# Patient Record
Sex: Female | Born: 1980 | Race: Black or African American | Hispanic: No | Marital: Married | Smoking: Never smoker
Health system: Southern US, Community
[De-identification: ages and names within clinical notes are randomized; demographics above are authoritative.]

## PROBLEM LIST (undated history)

## (undated) ENCOUNTER — Inpatient Hospital Stay (HOSPITAL_COMMUNITY): Payer: Self-pay

## (undated) DIAGNOSIS — O99280 Endocrine, nutritional and metabolic diseases complicating pregnancy, unspecified trimester: Secondary | ICD-10-CM

## (undated) DIAGNOSIS — E079 Disorder of thyroid, unspecified: Secondary | ICD-10-CM

## (undated) DIAGNOSIS — B181 Chronic viral hepatitis B without delta-agent: Secondary | ICD-10-CM

## (undated) DIAGNOSIS — N390 Urinary tract infection, site not specified: Secondary | ICD-10-CM

## (undated) DIAGNOSIS — Z789 Other specified health status: Secondary | ICD-10-CM

## (undated) DIAGNOSIS — O98919 Unspecified maternal infectious and parasitic disease complicating pregnancy, unspecified trimester: Secondary | ICD-10-CM

## (undated) HISTORY — DX: Disorder of thyroid, unspecified: E07.9

## (undated) HISTORY — DX: Endocrine, nutritional and metabolic diseases complicating pregnancy, unspecified trimester: O99.280

## (undated) HISTORY — PX: NO PAST SURGERIES: SHX2092

## (undated) HISTORY — DX: Unspecified maternal infectious and parasitic disease complicating pregnancy, unspecified trimester: O98.919

## (undated) HISTORY — DX: Chronic viral hepatitis B without delta-agent: B18.1

## (undated) HISTORY — DX: Urinary tract infection, site not specified: N39.0

---

## 2014-10-31 ENCOUNTER — Other Ambulatory Visit (HOSPITAL_COMMUNITY): Payer: Self-pay | Admitting: Nurse Practitioner

## 2014-10-31 DIAGNOSIS — Z3682 Encounter for antenatal screening for nuchal translucency: Secondary | ICD-10-CM

## 2014-11-08 ENCOUNTER — Inpatient Hospital Stay (HOSPITAL_COMMUNITY)
Admission: AD | Admit: 2014-11-08 | Discharge: 2014-11-08 | Disposition: A | Discharge status: Home / Self Care | Payer: Medicaid Other | Source: Ambulatory Visit | Attending: Obstetrics & Gynecology | Admitting: Obstetrics & Gynecology

## 2014-11-08 ENCOUNTER — Encounter (HOSPITAL_COMMUNITY): Payer: Self-pay | Admitting: *Deleted

## 2014-11-08 DIAGNOSIS — Z3A13 13 weeks gestation of pregnancy: Secondary | ICD-10-CM | POA: Diagnosis not present

## 2014-11-08 DIAGNOSIS — O210 Mild hyperemesis gravidarum: Secondary | ICD-10-CM | POA: Insufficient documentation

## 2014-11-08 DIAGNOSIS — O219 Vomiting of pregnancy, unspecified: Secondary | ICD-10-CM

## 2014-11-08 HISTORY — DX: Other specified health status: Z78.9

## 2014-11-08 LAB — URINE MICROSCOPIC-ADD ON

## 2014-11-08 LAB — URINALYSIS, ROUTINE W REFLEX MICROSCOPIC
Bilirubin Urine: NEGATIVE
Glucose, UA: NEGATIVE mg/dL
Hgb urine dipstick: NEGATIVE
Ketones, ur: 40 mg/dL — AB
Nitrite: NEGATIVE
Protein, ur: NEGATIVE mg/dL
Specific Gravity, Urine: 1.015 (ref 1.005–1.030)
Urobilinogen, UA: 1 mg/dL (ref 0.0–1.0)
pH: 6.5 (ref 5.0–8.0)

## 2014-11-08 MED ORDER — DEXTROSE 5 % IN LACTATED RINGERS IV BOLUS
1000.0000 mL | Freq: Once | INTRAVENOUS | Status: AC
  Administered 2014-11-08: 1000 mL via INTRAVENOUS

## 2014-11-08 MED ORDER — PROMETHAZINE HCL 25 MG PO TABS
12.5000 mg | ORAL_TABLET | Freq: Four times a day (QID) | ORAL | Status: DC | PRN
Start: 2014-11-08 — End: 2015-05-04

## 2014-11-08 NOTE — Discharge Instructions (Signed)

## 2014-11-08 NOTE — MAU Provider Note (Signed)
Chief Complaint: Fatigue   First Provider Initiated Contact with Patient 11/08/14 1523      SUBJECTIVE HPI: Michelle Logan is a 34 y.o. G1P0 at [redacted]w[redacted]d by LMP who presents to maternity admissions reporting nausea and vomiting and decreased appetite.  She also reports she is nauseous at night and unable to sleep so is fatigued during the day.  She has tried Unisom/B6 which makes her sleepy but does not help. She has not taken these medications daily.   She denies abdominal pain, vaginal bleeding, vaginal itching/burning, urinary symptoms, h/a, dizziness, or fever/chills.     Emesis  This is a recurrent problem. The current episode started 1 to 4 weeks ago. The problem occurs 2 to 4 times per day. The problem has been unchanged. The emesis has an appearance of stomach contents. There has been no fever. Pertinent negatives include no abdominal pain, chest pain, chills, coughing, diarrhea, dizziness, fever or headaches. She has tried diet change (Unisom/B6) for the symptoms. The treatment provided no relief.    Past Medical History  Diagnosis Date  . Medical history non-contributory    Past Surgical History  Procedure Laterality Date  . No past surgeries     Social History   Social History  . Marital Status: Married    Spouse Name: N/A  . Number of Children: N/A  . Years of Education: N/A   Occupational History  . Not on file.   Social History Main Topics  . Smoking status: Never Smoker   . Smokeless tobacco: Never Used  . Alcohol Use: No  . Drug Use: No  . Sexual Activity: Not on file   Other Topics Concern  . Not on file   Social History Narrative  . No narrative on file   No current facility-administered medications on file prior to encounter.   No current outpatient prescriptions on file prior to encounter.   No Known Allergies  ROS:  Review of Systems  Constitutional: Negative for fever, chills and fatigue.  HENT: Negative for sinus pressure.   Eyes: Negative  for photophobia.  Respiratory: Negative for cough and shortness of breath.   Cardiovascular: Negative for chest pain.  Gastrointestinal: Positive for vomiting. Negative for nausea, abdominal pain, diarrhea and constipation.  Genitourinary: Negative for dysuria, frequency, flank pain, vaginal bleeding, vaginal discharge, difficulty urinating, vaginal pain and pelvic pain.  Musculoskeletal: Negative for neck pain.  Neurological: Negative for dizziness, weakness and headaches.  Psychiatric/Behavioral: Negative.      I have reviewed patient's Past Medical Hx, Surgical Hx, Family Hx, Social Hx, medications and allergies.   Physical Exam  Patient Vitals for the past 24 hrs:  BP Temp Temp src Pulse Resp SpO2 Weight  11/08/14 1326 106/66 mmHg 98.3 F (36.8 C) Oral 86 16 100 % 58.695 kg (129 lb 6.4 oz)   Constitutional: Well-developed, well-nourished female in no acute distress.  Cardiovascular: normal rate Respiratory: normal effort GI: Abd soft, non-tender. Pos BS x 4 MS: Extremities nontender, no edema, normal ROM Neurologic: Alert and oriented x 4.  GU: Neg CVAT.   LAB RESULTS Results for orders placed or performed during the hospital encounter of 11/08/14 (from the past 24 hour(s))  Urinalysis, Routine w reflex microscopic (not at [facility])     Status: Abnormal   Collection Time: 11/08/14  1:30 PM  Result Value Ref Range   Color, Urine YELLOW YELLOW   APPearance HAZY (A) CLEAR   Specific Gravity, Urine 1.015 1.005 - 1.030   pH 6.5 5.0 -  8.0   Glucose, UA NEGATIVE NEGATIVE mg/dL   Hgb urine dipstick NEGATIVE NEGATIVE   Bilirubin Urine NEGATIVE NEGATIVE   Ketones, ur 40 (A) NEGATIVE mg/dL   Protein, ur NEGATIVE NEGATIVE mg/dL   Urobilinogen, UA 1.0 0.0 - 1.0 mg/dL   Nitrite NEGATIVE NEGATIVE   Leukocytes, UA MODERATE (A) NEGATIVE  Urine microscopic-add on     Status: Abnormal   Collection Time: 11/08/14  1:30 PM  Result Value Ref Range   Squamous Epithelial / LPF FEW (A) RARE    WBC, UA 0-2 <3 WBC/hpf   Urine-Other AMORPHOUS URATES/PHOSPHATES        IMAGING No results found.  MAU Management/MDM: Ordered labs and reviewed results.  Treatments in MAU included D5LR x 1000 ml.  Pt stable at time of discharge.  ASSESSMENT 1. Nausea and vomiting during pregnancy prior to [redacted] weeks gestation     PLAN Discharge home Phenergan 12.5-25 mg PO Q 6 hours PRN, take especially at night to aid sleep Discussed use of OTC medications, like Unisom/B6 to take every day to prevent nausea [facility]    Medication List    TAKE these medications        promethazine 25 MG tablet  Commonly known as:  PHENERGAN  Take 0.5-1 tablets (12.5-25 mg total) by mouth every 6 (six) hours as needed for nausea or vomiting.           Follow-up Information    [facility]   Why:  As scheduled, Return to MAU as needed for emergencies   Contact information:   [address] [place] [address] [phone]       Sharen CounterLisa Leftwich-Kirby Certified Nurse-Midwife 11/08/2014  3:48 PM

## 2014-11-08 NOTE — MAU Note (Signed)
Very tired, no appetite.  Throws up once or twice in the mornings.

## 2014-11-10 ENCOUNTER — Ambulatory Visit (HOSPITAL_COMMUNITY)
Admission: RE | Admit: 2014-11-10 | Discharge: 2014-11-10 | Disposition: A | Discharge status: Home / Self Care | Payer: Medicaid Other | Source: Ambulatory Visit | Attending: Nurse Practitioner | Admitting: Nurse Practitioner

## 2014-11-10 ENCOUNTER — Encounter (HOSPITAL_COMMUNITY): Payer: Self-pay

## 2014-11-10 DIAGNOSIS — Z36 Encounter for antenatal screening of mother: Secondary | ICD-10-CM | POA: Insufficient documentation

## 2014-11-10 DIAGNOSIS — Z3A12 12 weeks gestation of pregnancy: Secondary | ICD-10-CM | POA: Diagnosis not present

## 2014-11-10 DIAGNOSIS — Z3682 Encounter for antenatal screening for nuchal translucency: Secondary | ICD-10-CM

## 2014-11-12 LAB — CULTURE, OB URINE: Culture: >=100000

## 2014-11-14 ENCOUNTER — Telehealth: Payer: Self-pay | Admitting: Medical

## 2014-11-14 ENCOUNTER — Telehealth: Payer: Self-pay | Admitting: Student

## 2014-11-14 DIAGNOSIS — N3000 Acute cystitis without hematuria: Secondary | ICD-10-CM

## 2014-11-14 MED ORDER — NITROFURANTOIN MONOHYD MACRO 100 MG PO CAPS: 100.0000 mg | ORAL_CAPSULE | Freq: Two times a day (BID) | ORAL | Status: DC

## 2014-11-14 NOTE — Telephone Encounter (Signed)
Pt returning call.  Informed of positive urine culture and antibiotic sent to her pharmacy.  All questions answered.   Judeth HornErin Daud Cayer, NP

## 2014-11-14 NOTE — Telephone Encounter (Signed)
LM for patient to return call to MAU. Urine culture needs treatment with Macrobid. Rx sent to patient's pharmacy.   Marny LowensteinJulie N Wenzel, PA-C 11/14/2014 9:07 AM

## 2014-11-18 ENCOUNTER — Other Ambulatory Visit (HOSPITAL_COMMUNITY): Payer: Self-pay

## 2014-11-18 LAB — OB RESULTS CONSOLE ABO/RH: RH TYPE: POSITIVE

## 2014-11-18 LAB — OB RESULTS CONSOLE RPR: RPR: NONREACTIVE

## 2014-11-18 LAB — OB RESULTS CONSOLE HIV ANTIBODY (ROUTINE TESTING): HIV: NONREACTIVE

## 2014-11-18 LAB — OB RESULTS CONSOLE RUBELLA ANTIBODY, IGM: RUBELLA: IMMUNE

## 2014-11-18 LAB — OB RESULTS CONSOLE ANTIBODY SCREEN: Antibody Screen: NEGATIVE

## 2014-11-18 LAB — OB RESULTS CONSOLE HEPATITIS B SURFACE ANTIGEN: HEP B S AG: POSITIVE

## 2014-11-18 LAB — OB RESULTS CONSOLE GC/CHLAMYDIA
Chlamydia: NEGATIVE
Gonorrhea: NEGATIVE

## 2014-12-21 ENCOUNTER — Encounter: Payer: Self-pay | Admitting: Infectious Disease

## 2014-12-21 ENCOUNTER — Ambulatory Visit (INDEPENDENT_AMBULATORY_CARE_PROVIDER_SITE_OTHER): Payer: Medicaid Other | Admitting: Infectious Disease

## 2014-12-21 VITALS — BP 110/67 | HR 92 | Temp 98.0°F | Ht 64.0 in | Wt 137.8 lb

## 2014-12-21 DIAGNOSIS — N3000 Acute cystitis without hematuria: Secondary | ICD-10-CM

## 2014-12-21 DIAGNOSIS — O99282 Endocrine, nutritional and metabolic diseases complicating pregnancy, second trimester: Secondary | ICD-10-CM | POA: Diagnosis not present

## 2014-12-21 DIAGNOSIS — E0590 Thyrotoxicosis, unspecified without thyrotoxic crisis or storm: Secondary | ICD-10-CM | POA: Insufficient documentation

## 2014-12-21 DIAGNOSIS — B181 Chronic viral hepatitis B without delta-agent: Secondary | ICD-10-CM | POA: Diagnosis not present

## 2014-12-21 DIAGNOSIS — O98919 Unspecified maternal infectious and parasitic disease complicating pregnancy, unspecified trimester: Secondary | ICD-10-CM

## 2014-12-21 DIAGNOSIS — E079 Disorder of thyroid, unspecified: Secondary | ICD-10-CM

## 2014-12-21 DIAGNOSIS — N390 Urinary tract infection, site not specified: Secondary | ICD-10-CM

## 2014-12-21 DIAGNOSIS — O99280 Endocrine, nutritional and metabolic diseases complicating pregnancy, unspecified trimester: Secondary | ICD-10-CM

## 2014-12-21 DIAGNOSIS — O98912 Unspecified maternal infectious and parasitic disease complicating pregnancy, second trimester: Secondary | ICD-10-CM | POA: Diagnosis not present

## 2014-12-21 HISTORY — DX: Chronic viral hepatitis B without delta-agent: B18.1

## 2014-12-21 HISTORY — DX: Disorder of thyroid, unspecified: E07.9

## 2014-12-21 HISTORY — DX: Unspecified maternal infectious and parasitic disease complicating pregnancy, unspecified trimester: O98.919

## 2014-12-21 HISTORY — DX: Endocrine, nutritional and metabolic diseases complicating pregnancy, unspecified trimester: O99.280

## 2014-12-21 HISTORY — DX: Urinary tract infection, site not specified: N39.0

## 2014-12-21 LAB — COMPLETE METABOLIC PANEL WITH GFR
ALBUMIN: 3.6 g/dL (ref 3.6–5.1)
ALK PHOS: 62 U/L (ref 33–115)
ALT: 12 U/L (ref 6–29)
AST: 19 U/L (ref 10–30)
BILIRUBIN TOTAL: 0.2 mg/dL (ref 0.2–1.2)
BUN: 5 mg/dL — ABNORMAL LOW (ref 7–25)
CALCIUM: 9.1 mg/dL (ref 8.6–10.2)
CO2: 25 mmol/L (ref 20–31)
Chloride: 102 mmol/L (ref 98–110)
Creat: 0.54 mg/dL (ref 0.50–1.10)
GFR, Est African American: >89 mL/min (ref >=60)
GLUCOSE: 102 mg/dL — AB (ref 65–99)
POTASSIUM: 3.7 mmol/L (ref 3.5–5.3)
Sodium: 137 mmol/L (ref 135–146)
TOTAL PROTEIN: 6 g/dL — AB (ref 6.1–8.1)

## 2014-12-21 NOTE — Progress Notes (Signed)
Consult: hepatitis B in pregnancy  Requesting physician: Dr. Normand Sloop Subjective:    Patient ID: Michelle Logan, female    DOB: Nov 09, 1980, 34 y.o.   MRN: [id number]  HPI  34 year old [place] lady who was diagnosed with Hepatitis B with pregnancy labs. She tested negative for HIV. She is unaware of ever having tested + for Hep B and states that she must've been checked prior to immigration to the [place]. She denies IVDU. She is accompanied by her husband.  She has had some nausea and is now 5 months into her pregnancy. She has minimal abdominal pain but no RUQ pain, no jaundice  Past Medical History  Diagnosis Date  . Medical history non-contributory   . Chronic hepatitis B without delta agent without hepatic coma (HCC) 12/21/2014  . Pregnancy and infectious disease 12/21/2014    Past Surgical History  Procedure Laterality Date  . No past surgeries      No family history on file.    Social History   Social History  . Marital Status: Married    Spouse Name: N/A  . Number of Children: N/A  . Years of Education: N/A   Social History Main Topics  . Smoking status: Never Smoker   . Smokeless tobacco: Never Used  . Alcohol Use: No  . Drug Use: No  . Sexual Activity:    Partners: Male   Other Topics Concern  . Not on file   Social History Narrative    No Known Allergies   Current outpatient prescriptions:  .  methimazole (TAPAZOLE) 5 MG tablet, Take 5 mg by mouth daily., Disp: , Rfl:  .  promethazine (PHENERGAN) 25 MG tablet, Take 0.5-1 tablets (12.5-25 mg total) by mouth every 6 (six) hours as needed for nausea or vomiting., Disp: 30 tablet, Rfl: 2    Review of Systems  Constitutional: Negative for fever, chills, diaphoresis, activity change, appetite change, fatigue and unexpected weight change.  HENT: Negative for congestion, rhinorrhea, sinus pressure, sneezing, sore throat and trouble swallowing.   Eyes: Negative for photophobia and visual disturbance.    Respiratory: Negative for cough, chest tightness, shortness of breath, wheezing and stridor.   Cardiovascular: Negative for chest pain, palpitations and leg swelling.  Gastrointestinal: Positive for nausea. Negative for vomiting, abdominal pain, diarrhea, constipation, blood in stool, abdominal distention and anal bleeding.  Genitourinary: Negative for dysuria, hematuria, flank pain and difficulty urinating.  Musculoskeletal: Negative for myalgias, back pain, joint swelling, arthralgias and gait problem.  Skin: Negative for color change, pallor, rash and wound.  Neurological: Negative for dizziness, tremors, weakness and light-headedness.  Hematological: Negative for adenopathy. Does not bruise/bleed easily.  Psychiatric/Behavioral: Negative for behavioral problems, confusion, sleep disturbance, dysphoric mood, decreased concentration and agitation.       Objective:   Physical Exam  Constitutional: She is oriented to person, place, and time. She appears well-developed and well-nourished. No distress.  HENT:  Head: Normocephalic and atraumatic.  Mouth/Throat: No oropharyngeal exudate.  Eyes: Conjunctivae and EOM are normal. No scleral icterus.  Neck: Normal range of motion. Neck supple.  Cardiovascular: Normal rate and regular rhythm.   Pulmonary/Chest: Effort normal. No respiratory distress. She has no wheezes.  Abdominal: Soft. Bowel sounds are normal. She exhibits no distension. There is no tenderness. There is no rebound.  Musculoskeletal: She exhibits no edema or tenderness.  Neurological: She is alert and oriented to person, place, and time. She exhibits normal muscle tone. Coordination normal.  Skin: Skin is warm and  dry. No rash noted. She is not diaphoretic. No erythema. No pallor.  Psychiatric: She has a normal mood and affect. Her behavior is normal. Judgment and thought content normal.          Assessment & Plan:   Chronic hepatitis B without delta agent without coma  or cirrhosis in pregnant patient:  Check HBV , hep B E antigen and hep B e ag ab, CMP, Hep A ab  If VL significant will start her on Viread. Her child will need the usual Hep B precautions taken at birth  She will also eventually need screening for  HCC  [facility]  Thyroid disease during pregnancy: on methimazole     Acey Lavornelius Van Dam, MD

## 2014-12-22 LAB — HEPATITIS A ANTIBODY, TOTAL: HEP A TOTAL AB: REACTIVE — AB

## 2014-12-23 LAB — HEPATITIS B E ANTIBODY: Hepatitis Be Antibody: REACTIVE — AB

## 2014-12-23 LAB — HEPATITIS B E ANTIGEN: Hepatitis Be Antigen: NONREACTIVE

## 2014-12-26 LAB — HEPATITIS B DNA, ULTRAQUANTITATIVE, PCR
HEPATITIS B DNA (CALC): 2.45 {Log_IU}/mL — AB (ref <1.30)
Hepatitis B DNA: 279 IU/mL — ABNORMAL HIGH (ref <20)

## 2015-01-03 ENCOUNTER — Telehealth: Payer: Self-pay | Admitting: *Deleted

## 2015-01-03 NOTE — Telephone Encounter (Signed)
Patient notified

## 2015-01-03 NOTE — Telephone Encounter (Signed)
-----   Message from Randall Hissornelius N Van Dam, MD sent at 12/30/2014  7:41 PM EST ----- Based on patients current low viral load she should not need to be initiated on hep B treatment with tenofovir but would need HBIG immunoglobulin and Hepaitis B vaccine series started within 12 hours of birth

## 2015-01-08 NOTE — L&D Delivery Note (Signed)
Final Labor Progress Note Called to room evaluate patient 0736, Pt  VE C/C/+2.  FHR remained reassuring with occasional early decelerations.  Vaginal Delivery Note The pt utilized coping skills as pain management.   Spontaneous rupture of membranes today, at 0650, clear.  GBS was positive, Ampicillin x1 dose was given.  Cervical dilation was complete at  0731 NICHD Category 1.     Pushing with guidance began at  0740.   After 14 minutes of pushing the head, shoulders and the body of a viable female infant "Michelle Logan" delivered spontaneously with maternal effort in the ROA position at 0754.   With vigorous tone and spontaneous cry, the infant was placed on moms abd.  After the umbilical cord was clamped it was cut by the Patient, then cord blood was obtained for evaluation.  Spontaneous delivery of a intact placenta with a 3 vessel cord via Shultz at  0800.   Episiotomy: None   The vulva, perineum, vaginal vault, rectum and cervix were inspected and revealed a Second degree perineal, repaired using a 3-0 vicryl on a CT needle and a deep right labial was repaired using a 4-0 vicryl on a SH and 10cc of 1% lidocaine. The rectum sphincter intact after the repair. Patient tolerated repair well.   Postpartum pitocin as ordered.  Fundus firm, lochia minimum, bleeding under control.   EBL 200, Pt hemodynamically stable.   Sponge, laps and needle count correct and verified with the primary care nurse.  Attending MD available at all times.    Routine postpartum orders   Mother unsure about method of contraception  Mom plans to breastfeed   Placenta to pathology: NO     Cord Gases sent to lab: NO Cord blood sent to lab: YES   APGARS:  9 at 1 minute and 9 at 5 minutes Weight:.    Both mom and baby were left in stable condition, baby skin to skin.   Alphonzo Severanceachel Leonard Hendler, CNM, MSN 05/02/2015. 11:19 AM

## 2015-01-10 NOTE — Progress Notes (Signed)
Thanks Minh! 

## 2015-04-24 LAB — OB RESULTS CONSOLE GBS: GBS: POSITIVE

## 2015-05-02 ENCOUNTER — Encounter (HOSPITAL_COMMUNITY): Payer: Self-pay

## 2015-05-02 ENCOUNTER — Inpatient Hospital Stay (HOSPITAL_COMMUNITY)
Admission: AD | Admit: 2015-05-02 | Discharge: 2015-05-04 | DRG: 774 | Disposition: A | Discharge status: Home / Self Care | Payer: Medicaid Other | Source: Ambulatory Visit | Attending: Obstetrics & Gynecology | Admitting: Obstetrics & Gynecology

## 2015-05-02 DIAGNOSIS — Z3A38 38 weeks gestation of pregnancy: Secondary | ICD-10-CM | POA: Diagnosis not present

## 2015-05-02 DIAGNOSIS — O99284 Endocrine, nutritional and metabolic diseases complicating childbirth: Principal | ICD-10-CM | POA: Diagnosis present

## 2015-05-02 DIAGNOSIS — O99824 Streptococcus B carrier state complicating childbirth: Secondary | ICD-10-CM | POA: Diagnosis present

## 2015-05-02 DIAGNOSIS — E0590 Thyrotoxicosis, unspecified without thyrotoxic crisis or storm: Secondary | ICD-10-CM | POA: Diagnosis present

## 2015-05-02 DIAGNOSIS — O9842 Viral hepatitis complicating childbirth: Secondary | ICD-10-CM | POA: Diagnosis present

## 2015-05-02 DIAGNOSIS — Z23 Encounter for immunization: Secondary | ICD-10-CM | POA: Diagnosis not present

## 2015-05-02 DIAGNOSIS — O99283 Endocrine, nutritional and metabolic diseases complicating pregnancy, third trimester: Secondary | ICD-10-CM | POA: Diagnosis present

## 2015-05-02 DIAGNOSIS — O99280 Endocrine, nutritional and metabolic diseases complicating pregnancy, unspecified trimester: Secondary | ICD-10-CM

## 2015-05-02 DIAGNOSIS — IMO0002 Reserved for concepts with insufficient information to code with codable children: Secondary | ICD-10-CM | POA: Diagnosis present

## 2015-05-02 DIAGNOSIS — O9902 Anemia complicating childbirth: Secondary | ICD-10-CM | POA: Diagnosis present

## 2015-05-02 DIAGNOSIS — B181 Chronic viral hepatitis B without delta-agent: Secondary | ICD-10-CM | POA: Diagnosis present

## 2015-05-02 DIAGNOSIS — B951 Streptococcus, group B, as the cause of diseases classified elsewhere: Secondary | ICD-10-CM | POA: Diagnosis present

## 2015-05-02 LAB — RAPID HIV SCREEN (HIV 1/2 AB+AG)
HIV 1/2 ANTIBODIES: NONREACTIVE
HIV-1 P24 Antigen - HIV24: NONREACTIVE

## 2015-05-02 LAB — TYPE AND SCREEN
ABO/RH(D): AB POS
Antibody Screen: NEGATIVE

## 2015-05-02 LAB — CBC
HCT: 30.6 % — ABNORMAL LOW (ref 36.0–46.0)
Hemoglobin: 10.2 g/dL — ABNORMAL LOW (ref 12.0–15.0)
MCH: 25.9 pg — ABNORMAL LOW (ref 26.0–34.0)
MCHC: 33.3 g/dL (ref 30.0–36.0)
MCV: 77.7 fL — AB (ref 78.0–100.0)
Platelets: 266 10*3/uL (ref 150–400)
RBC: 3.94 MIL/uL (ref 3.87–5.11)
RDW: 14.6 % (ref 11.5–15.5)
WBC: 14.1 10*3/uL — AB (ref 4.0–10.5)

## 2015-05-02 LAB — ABO/RH: ABO/RH(D): AB POS

## 2015-05-02 LAB — RPR: RPR: NONREACTIVE

## 2015-05-02 MED ORDER — ACETAMINOPHEN 325 MG PO TABS: 650.0000 mg | ORAL_TABLET | ORAL | Status: DC | PRN

## 2015-05-02 MED ORDER — LACTATED RINGERS IV SOLN
INTRAVENOUS | Status: DC
  Administered 2015-05-02: 125 mL/h via INTRAVENOUS

## 2015-05-02 MED ORDER — COCONUT OIL OIL: 1.0000 "application " | TOPICAL_OIL | Status: DC | PRN

## 2015-05-02 MED ORDER — SODIUM CHLORIDE 0.9 % IV SOLN
2.0000 g | Freq: Once | INTRAVENOUS | Status: AC
  Administered 2015-05-02: 2 g via INTRAVENOUS
  Filled 2015-05-02: qty 2000

## 2015-05-02 MED ORDER — ZOLPIDEM TARTRATE 5 MG PO TABS: 5.0000 mg | ORAL_TABLET | Freq: Every evening | ORAL | Status: DC | PRN

## 2015-05-02 MED ORDER — OXYTOCIN BOLUS FROM INFUSION: 500.0000 mL | INTRAVENOUS | Status: DC

## 2015-05-02 MED ORDER — IBUPROFEN 600 MG PO TABS
600.0000 mg | ORAL_TABLET | Freq: Four times a day (QID) | ORAL | Status: DC
  Administered 2015-05-02 – 2015-05-04 (×8): 600 mg via ORAL
  Filled 2015-05-02 (×8): qty 1

## 2015-05-02 MED ORDER — FENTANYL CITRATE (PF) 100 MCG/2ML IJ SOLN
50.0000 ug | INTRAMUSCULAR | Status: DC | PRN
  Administered 2015-05-02: 50 ug via INTRAVENOUS
  Administered 2015-05-02: 100 ug via INTRAVENOUS
  Filled 2015-05-02 (×2): qty 2

## 2015-05-02 MED ORDER — METHIMAZOLE 5 MG PO TABS
5.0000 mg | ORAL_TABLET | Freq: Every day | ORAL | Status: DC
  Administered 2015-05-02 – 2015-05-04 (×3): 5 mg via ORAL
  Filled 2015-05-02 (×3): qty 1

## 2015-05-02 MED ORDER — OXYTOCIN 10 UNIT/ML IJ SOLN
2.5000 [IU]/h | INTRAVENOUS | Status: DC
  Administered 2015-05-02: 39.96 [IU]/h via INTRAVENOUS
  Filled 2015-05-02: qty 4

## 2015-05-02 MED ORDER — ONDANSETRON HCL 4 MG/2ML IJ SOLN: 4.0000 mg | INTRAMUSCULAR | Status: DC | PRN

## 2015-05-02 MED ORDER — ONDANSETRON HCL 4 MG/2ML IJ SOLN: 4.0000 mg | Freq: Four times a day (QID) | INTRAMUSCULAR | Status: DC | PRN

## 2015-05-02 MED ORDER — LACTATED RINGERS IV SOLN
500.0000 mL | INTRAVENOUS | Status: DC | PRN
Start: 2015-05-02 — End: 2015-05-02

## 2015-05-02 MED ORDER — DIBUCAINE 1 % RE OINT: 1.0000 "application " | TOPICAL_OINTMENT | RECTAL | Status: DC | PRN

## 2015-05-02 MED ORDER — LIDOCAINE HCL (PF) 1 % IJ SOLN
30.0000 mL | INTRAMUSCULAR | Status: AC | PRN
  Administered 2015-05-02: 30 mL via SUBCUTANEOUS
  Filled 2015-05-02 (×2): qty 30

## 2015-05-02 MED ORDER — PRENATAL MULTIVITAMIN CH
1.0000 | ORAL_TABLET | Freq: Every day | ORAL | Status: DC
  Administered 2015-05-03: 1 via ORAL
  Filled 2015-05-02: qty 1

## 2015-05-02 MED ORDER — OXYCODONE-ACETAMINOPHEN 5-325 MG PO TABS: 2.0000 | ORAL_TABLET | ORAL | Status: DC | PRN

## 2015-05-02 MED ORDER — SIMETHICONE 80 MG PO CHEW: 80.0000 mg | CHEWABLE_TABLET | ORAL | Status: DC | PRN

## 2015-05-02 MED ORDER — DEXTROSE 5 % IV SOLN
2.5000 10*6.[IU] | INTRAVENOUS | Status: DC
  Filled 2015-05-02 (×5): qty 2.5

## 2015-05-02 MED ORDER — CITRIC ACID-SODIUM CITRATE 334-500 MG/5ML PO SOLN: 30.0000 mL | ORAL | Status: DC | PRN

## 2015-05-02 MED ORDER — DIPHENHYDRAMINE HCL 25 MG PO CAPS: 25.0000 mg | ORAL_CAPSULE | Freq: Four times a day (QID) | ORAL | Status: DC | PRN

## 2015-05-02 MED ORDER — WITCH HAZEL-GLYCERIN EX PADS: 1.0000 "application " | MEDICATED_PAD | CUTANEOUS | Status: DC | PRN

## 2015-05-02 MED ORDER — PENICILLIN G POTASSIUM 5000000 UNITS IJ SOLR
5.0000 10*6.[IU] | Freq: Once | INTRAVENOUS | Status: DC
  Filled 2015-05-02: qty 5

## 2015-05-02 MED ORDER — SENNOSIDES-DOCUSATE SODIUM 8.6-50 MG PO TABS
2.0000 | ORAL_TABLET | ORAL | Status: DC
  Administered 2015-05-02 – 2015-05-03 (×2): 2 via ORAL
  Filled 2015-05-02 (×2): qty 2

## 2015-05-02 MED ORDER — OXYCODONE-ACETAMINOPHEN 5-325 MG PO TABS
1.0000 | ORAL_TABLET | ORAL | Status: DC | PRN
  Administered 2015-05-02: 1 via ORAL
  Filled 2015-05-02: qty 1

## 2015-05-02 MED ORDER — ONDANSETRON HCL 4 MG PO TABS
4.0000 mg | ORAL_TABLET | ORAL | Status: DC | PRN
Start: 2015-05-02 — End: 2015-05-04

## 2015-05-02 MED ORDER — TETANUS-DIPHTH-ACELL PERTUSSIS 5-2.5-18.5 LF-MCG/0.5 IM SUSP: 0.5000 mL | Freq: Once | INTRAMUSCULAR | Status: DC

## 2015-05-02 MED ORDER — BENZOCAINE-MENTHOL 20-0.5 % EX AERO
1.0000 "application " | INHALATION_SPRAY | CUTANEOUS | Status: DC | PRN
  Administered 2015-05-02 – 2015-05-03 (×2): 1 via TOPICAL
  Filled 2015-05-02 (×3): qty 56

## 2015-05-02 NOTE — MAU Note (Signed)
Walk patient for 1 hour and recheck her per Sabas SousJ Emly CNM

## 2015-05-02 NOTE — MAU Note (Signed)
Pt may go to room 174 in 15 minutes

## 2015-05-02 NOTE — H&P (Signed)
Michelle Logan MRN: [id number]  Subjective: -  Objective: BP 114/73 mmHg  Pulse 102  Temp(Src) 98.2 F (36.8 C) (Oral)  Resp 20  Ht 5\' 4"  (1.626 m)  Wt 71.668 kg (158 lb)  BMI 27.11 kg/m2  SpO2 100%  LMP 08/09/2014      Fetal Monitoring: FHT: 140 bpm, Mod Var, -Decels, +Accels UC: Q2-815min, palpates moderate    Vaginal Exam: SVE:   Dilation: 8 Effacement (%): 90 Station: 0 Exam by:: Michelle Logan, CNM Membranes:SROM  Internal Monitors: None  Augmentation/Induction: Pitocin:None Cytotec: None  Assessment:  IUP at 38wks Cat I FT  Active Labor-Transition Phase GBS Positive  Plan: -VE discussed -Okay for additional dosing of IV pain medications as desired -Will switch PCN to Ampicillin 2 grams in anticipation of delivery -Continue other mgmt as ordered   Michelle CavaJessica L Yaneli Keithley,MSN, CNM 05/02/2015, 7:00 AM

## 2015-05-02 NOTE — H&P (Signed)
Michelle Logan is a 35 y.o. female, G1P0 at 55 weeks, presenting for contractions.  Patient pregnancy and medical history significant for Late to [facility], GBS positive, marginal cord insertion, Chronic Hep. B, and Thyroid disorder.  Patient is currently on tapazole and took last dose yesterday.  Patient considering an epidural for pain mgmt.   Patient Active Problem List   Diagnosis Date Noted  . Chronic hepatitis B without delta agent without hepatic coma (HCC) 12/21/2014  . Pregnancy and infectious disease 12/21/2014  . Thyroid disease during pregnancy 12/21/2014  . UTI (urinary tract infection) 12/21/2014    History of present pregnancy: Patient entered care at 14 weeks.   EDC of 05/16/2015 was established by Definite LMP of 08/09/2014.   Anatomy scan:  19.6 weeks, with normal findings and an posterior placenta.   Additional [place] evaluations:  23.6wks: NORMAL ANATOMY US COMPLETION AND GROWTH, F/U GROWTH IN 4 WEEKS FOR MARGINAL CORD INSERTION.  27.6wks: [place]: SIUP, BREECH, NORMAL FLUID, GROWTH 34%, CX 3.63 CM. MARGINAL INSERTION OF THE CORD. 29.6wks: U/S: Singleton pregnancy. Vertex presentation. Posterior placenta. AFI is normal=65th%. Cervix closed. Adnexas unremarkable. Marginal cord appears-1.7cm from placental edge.  Significant prenatal events: 1st Trimester: Late to [facility] 2nd Trimester:C/O FATIGUE.  POSITIVE HBSAG RESULTS. Seen by infectious disease. PT. WITH LOW HEP B VIRAL LOAD NO MEDICATION RECOMMENDED NOW BUT VACCINATION/IGG AFTER DELIVERY. 3rd Trimester:   declined Flu vaccine Pt cc: nauseated and vomiting off and on with heartburn x 3 days.  Last evaluation:  04/28/2015 in office by Dr. Alinda Sierras.  FHR 140, BP 108/68, WT 158lbs, TWG 27lbs  OB History    Gravida Para Term Preterm AB TAB SAB Ectopic Multiple Living   1         0     Past Medical History  Diagnosis Date  . Medical history non-contributory   . Chronic hepatitis B without delta agent without hepatic coma (HCC) 12/21/2014  .  Pregnancy and infectious disease 12/21/2014  . Thyroid disease during pregnancy 12/21/2014  . UTI (urinary tract infection) 12/21/2014   Past Surgical History  Procedure Laterality Date  . No past surgeries     Family History: family history is not on file. Social History:  reports that she has never smoked. She has never used smokeless tobacco. She reports that she does not drink alcohol or use illicit drugs.   Prenatal Transfer Tool  Maternal Diabetes: No Genetic Screening: Declined Maternal Ultrasounds/Referrals: Abnormal:  Findings:   Other: Marginal Cord Insertion, Infectious Disease Referral Fetal Ultrasounds or other Referrals:  None Maternal Substance Abuse:  No Significant Maternal Medications:  Meds include: Other: Tapazole Significant Maternal Lab Results: Lab values include: Group B Strep positive, HBsAG positive    ROS:  +FM, -LoF, +Bloody Show, +Ctx  No Known Allergies   Dilation: 5 Effacement (%): 90 Station: -2, -1 Exam by:: Camelia Eng RN Blood pressure 113/70, pulse 114, temperature 99 F (37.2 C), temperature source Oral, resp. rate 18, height  (1.626 m), weight 71.668 kg (158 lb), last menstrual period 08/09/2014, SpO2 100 %.  Physical Exam  Constitutional: She is oriented to person, place, and time. She appears well-developed and well-nourished. She appears distressed (With contractions).  HENT:  Head: Normocephalic and atraumatic.  Eyes: Conjunctivae are normal.  Neck: Normal range of motion.  Cardiovascular: Normal rate.   Respiratory: Effort normal.  GI: Soft.  Musculoskeletal: Normal range of motion.  Neurological: She is alert and oriented to person, place, and time.  Skin: Skin  is warm and dry.  Psychiatric: She has a normal mood and affect. Her behavior is normal.     FHR: 145 bpm, Mod Var, +Early Decels, +Accels UCs:  Q2-695min  Prenatal labs: ABO, Rh: AB/Positive/-- (11/11 0000) Antibody: Negative (11/11 0000) Rubella:   Immune RPR: Nonreactive (11/11 0000)  HBsAg: Positive (11/11 0000)  HIV: Non-reactive (11/11 0000)  GBS: Positive (04/17 0000) Sickle cell/Hgb electrophoresis:  Normal Pap:  Unknown GC:  Negative Chlamydia:  Negative Other:  TSH Normal    Assessment IUP at 38wks Cat I FT Active Labor GBS Positive Hep B Positive  [facility]: [facility]  Routine Labor and Delivery Orders per [facility] Protocol In room to complete assessment and discuss POC: Expectant Mgmt Okay for epidural when desired Start PCN for prophylaxis Peds to be notified of positive Hep B Dr.TC to be updated as appropriate  Joellyn QuailsJessica L EmlyCNM, MSN 05/02/2015, 3:54 AM

## 2015-05-02 NOTE — MAU Note (Signed)
Pt reports abd pain for 24 hours, some blood on tissue when she wipes.

## 2015-05-02 NOTE — Lactation Note (Signed)
This note was copied from a baby's chart. Lactation Consultation Note  Patient Name: Michelle Logan Reason for consult: Initial assessment Baby at 9 hr of life and mom reports baby has been sleeping since birth. Mom has only offered the breast x2 since birth. Discussed baby behavior, feeding frequency, baby belly size, voids, wt loss, breast changes, and nipple care. Demonstrated manual expression, large drops of colostrum noted bilaterally, spoon in room. Given lactation handouts. Aware of OP services and support group. Mom will offer the breast on demand 8+/24hr and will manually express to spoon feed if baby is not doing well at the breast.      Maternal Data Has patient been taught Hand Expression?: Yes Does the patient have breastfeeding experience prior to this delivery?: No  Feeding Feeding Type: Breast Fed Length of feed: 0 min  LATCH Score/Interventions Latch: Too sleepy or reluctant, no latch achieved, no sucking elicited. Intervention(s): Skin to skin;Teach feeding cues;Waking techniques  Audible Swallowing: None Intervention(s): Skin to skin;Hand expression  Type of Nipple: Everted at rest and after stimulation  Comfort (Breast/Nipple): Soft / non-tender     Hold (Positioning): Full assist, staff holds infant at breast Intervention(s): Support Pillows;Position options;Skin to skin  LATCH Score: 4  Lactation Tools Discussed/Used WIC Program: Yes   Consult Status Consult Status: Follow-up Date: 05/03/15 Follow-up type: In-patient    Michelle Logan Logan, 5:01 PM

## 2015-05-03 LAB — CBC
HCT: 25.2 % — ABNORMAL LOW (ref 36.0–46.0)
Hemoglobin: 8.4 g/dL — ABNORMAL LOW (ref 12.0–15.0)
MCH: 25.9 pg — ABNORMAL LOW (ref 26.0–34.0)
MCHC: 33.3 g/dL (ref 30.0–36.0)
MCV: 77.8 fL — ABNORMAL LOW (ref 78.0–100.0)
PLATELETS: 232 10*3/uL (ref 150–400)
RBC: 3.24 MIL/uL — ABNORMAL LOW (ref 3.87–5.11)
RDW: 14.7 % (ref 11.5–15.5)
WBC: 15.9 10*3/uL — AB (ref 4.0–10.5)

## 2015-05-03 MED ORDER — FERROUS SULFATE 325 (65 FE) MG PO TABS
325.0000 mg | ORAL_TABLET | Freq: Two times a day (BID) | ORAL | Status: DC
  Administered 2015-05-03 – 2015-05-04 (×2): 325 mg via ORAL
  Filled 2015-05-03 (×2): qty 1

## 2015-05-03 NOTE — Progress Notes (Signed)
UR chart review completed.  

## 2015-05-03 NOTE — Progress Notes (Addendum)
Michelle Logan   Subjective: Post Partum Day 1 Vaginal delivery, 2nd degree perineal and deep right labial laceration Patient up ad lib, denies syncope or dizziness. Reports consuming regular diet without issues and denies N/V No issues with urination and reports bleeding is appropriate  Feeding:  breast Contraceptive plan:   unsure  Objective: Temp:  [97.8 F (36.6 C)-98.4 F (36.9 C)] 97.8 F (36.6 C) (04/26 0548) Pulse Rate:  [78-100] 78 (04/26 0300) Resp:  [18-20] 18 (04/26 0548) BP: (99-132)/(62-79) 110/75 mmHg (04/26 0548)  Physical Exam:  General: alert and cooperative Ext: WNL, no significant  edema. No evidence of DVT seen on physical exam. Breast: Soft filling Lungs: CTAB Heart RRR without murmur  Abdomen:  Soft, fundus firm, lochia scant, + bowel sounds, non distended, non tender Lochia: appropriate Uterine Fundus: firm Laceration: healing well    Recent Labs  05/02/15 0400 05/03/15 0517  HGB 10.2* 8.4*  HCT 30.6* 25.2*    Assessment S/P Vaginal Delivery-Day 1 Stable  Normal Involution Anemia - a symptomatic, hemodynamicly stable.  Transfusion discussed if she becomes symptomatic  Plan: Continue current care Plan for discharge tomorrow, Breastfeeding and Lactation consult Lactation support Iron supplement   Lorenzo Pereyra, CNM, MSN 05/03/2015, 9:29 AM

## 2015-05-03 NOTE — Lactation Note (Signed)
This note was copied from a baby's chart. Lactation Consultation Note  Patient Name: Girl Dione Ploverbidemi Castles ONGEX'BToday's Date: 05/03/2015 Reason for consult: Follow-up assessment Baby at 32 hr of life and mom reports bf is going well. She denies breast or nipple pain. She asked about pumping for return to work. Given a Harmony, encouraged to keep putting baby to breast while she is home. Discussed when to start pumping, pumping between feedings, how long to pumping, realistic amount to expect when pumping, milk storage/handing, pumping cleaning. Encouraged mom to f/u with WIC after d/c. She is aware of OP services and support group. She will call as needed for bf support.    Maternal Data    Feeding Feeding Type: Breast Fed  LATCH Score/Interventions Latch: Repeated attempts needed to sustain latch, nipple held in mouth throughout feeding, stimulation needed to elicit sucking reflex. Intervention(s): Skin to skin Intervention(s): Adjust position;Breast compression  Audible Swallowing: Spontaneous and intermittent Intervention(s): Hand expression  Type of Nipple: Everted at rest and after stimulation  Comfort (Breast/Nipple): Soft / non-tender     Hold (Positioning): No assistance needed to correctly position infant at breast. Intervention(s): Position options;Support Pillows  LATCH Score: 9  Lactation Tools Discussed/Used     Consult Status Consult Status: PRN    Rulon Eisenmengerlizabeth E Vickii Volland 05/03/2015, 4:26 PM

## 2015-05-04 MED ORDER — FERROUS SULFATE 325 (65 FE) MG PO TABS: 325.0000 mg | ORAL_TABLET | Freq: Two times a day (BID) | ORAL | Status: DC

## 2015-05-04 MED ORDER — IBUPROFEN 600 MG PO TABS: 600.0000 mg | ORAL_TABLET | Freq: Four times a day (QID) | ORAL | Status: DC

## 2015-05-04 NOTE — Discharge Summary (Signed)
OB Discharge Summary     Patient Name: Michelle Logan DOB: 02-01-1980 MRN: [id number]  Date of admission: 05/02/2015 Delivering MD: Alphonzo SeveranceSTALL, RACHEL   Date of discharge: 05/04/2015  Admitting diagnosis: 38 WEEKS CTX Intrauterine pregnancy: 6076w0d     Secondary diagnosis:  Active Problems:   Chronic hepatitis B without delta agent without hepatic coma (HCC)   Thyroid disease during pregnancy   Indication for care in labor or delivery   Group beta Strep positive   Marginal insertion of umbilical cord   Spontaneous vaginal delivery  Additional problems: None     Discharge diagnosis: Term Pregnancy Delivered and Repair of 2nd Degree and Right Labial Lacerations                                                                                                Post partum procedures:None  Augmentation: None  Complications: None  Hospital course:  Onset of Labor With Vaginal Delivery     35 y.o. yo G1P1001 at 7176w0d was admitted in Active Labor on 05/02/2015. Patient had an uncomplicated labor course as follows:  Membrane Rupture Time/Date: 6:50 AM ,05/02/2015   Intrapartum Procedures: Episiotomy: None [1]                                         Lacerations:  2nd degree [3];Labial [10]  Patient had a delivery of a Viable infant. 05/02/2015  Information for the patient's newborn:  [place], Girl Brian [[id number]][[id number]]  Delivery Method: Vaginal, Spontaneous Delivery (Filed from Delivery Summary)    Pateint had an uncomplicated postpartum course.  She is ambulating, tolerating a regular diet, passing flatus, and urinating well. Reports bleeding is better and infant is breastfeeding despite needing to be awakened. Patient is discharged home in stable condition on 05/04/2015.    Physical exam  Filed Vitals:   05/03/15 0300 05/03/15 0548 05/03/15 1800 05/04/15 0500  BP: 99/64 110/75 106/71 96/60  Pulse: 78  85 75  Temp: 98 F (36.7 C) 97.8 F (36.6 C) 97.7 F (36.5 C) 98 F (36.7 C)   TempSrc: Oral Oral Oral   Resp: 18 18 18 18   Height:      Weight:      SpO2:       Physical Exam:  General: alert, cooperative and no distress Mood/Affect: Appropriate/Bright Lungs: clear to auscultation, no wheezes, rales or rhonchi, symmetric air entry.  Heart: normal rate and regular rhythm. Breast: not examined. Abdomen:  + bowel sounds, Soft, NT Uterine Fundus: firm, U/-2 Lochia: appropriate Laceration: Not Examined Skin: Warm, Dry DVT Evaluation: No evidence of DVT seen on physical exam. No cords or calf tenderness.  Labs: Lab Results  Component Value Date   WBC 15.9* 05/03/2015   HGB 8.4* 05/03/2015   HCT 25.2* 05/03/2015   MCV 77.8* 05/03/2015   PLT 232 05/03/2015   CMP Latest Ref Rng 12/21/2014  Glucose 65 - 99 mg/dL 563(O102(H)  BUN 7 - 25 mg/dL 5(L)  Creatinine 7.560.50 - 1.10 mg/dL 4.330.54  Sodium 135 - 146 mmol/L 137  Potassium 3.5 - 5.3 mmol/L 3.7  Chloride 98 - 110 mmol/L 102  CO2 20 - 31 mmol/L 25  Calcium 8.6 - 10.2 mg/dL 9.1  Total Protein 6.1 - 8.1 g/dL 6.0(L)  Total Bilirubin 0.2 - 1.2 mg/dL 0.2  Alkaline Phos 33 - 115 U/L 62  AST 10 - 30 U/L 19  ALT 6 - 29 U/L 12    Discharge instruction: per After Visit Summary and "Baby and Me Booklet". Pain Management, Peri-Care, Breastfeeding, Who and When to call for postpartum complications. Information Sheet(s) given  Iron Rich Diet and Contraception Choices  After visit meds:    Medication List    ASK your doctor about these medications        methimazole 5 MG tablet  Commonly known as:  TAPAZOLE  Take 5 mg by mouth daily.     prenatal multivitamin Tabs tablet  Take 1 tablet by mouth daily at 12 noon.     promethazine 25 MG tablet  Commonly known as:  PHENERGAN  Take 0.5-1 tablets (12.5-25 mg total) by mouth every 6 (six) hours as needed for nausea or vomiting.        Diet: routine diet  Activity: Advance as tolerated. Pelvic rest for 6 weeks.   Outpatient follow up:6 weeks Follow up Appt:No  future appointments. Follow up Visit:No Follow-up on file.  Postpartum contraception: Undecided  Newborn Data: Live born female  Birth Weight: 5 lb 10.5 oz (2565 g) APGAR: 9, 9  Baby Feeding: Breast Disposition:home with mother   05/04/2015 Cherre Robins, CNM

## 2015-05-04 NOTE — Lactation Note (Signed)
This note was copied from a baby's chart. Lactation Consultation Note  Follow up visit made prior to discharge.  Mom is currently feeding baby.  Breasts are becoming full and milk easily hand expressed.   Assisted with latching baby deeper.  Instructed on milk coming to volume and engorgement treatment.  Manual pump given with instructions on use, cleaning and EBM storage.  Lactation outpatient services and support reviewed and encouraged prn.  Patient Name: Michelle Logan'UToday's Date: 05/04/2015 Reason for consult: Follow-up assessment   Maternal Data    Feeding Feeding Type: Breast Fed Length of feed: 15 min  LATCH Score/Interventions Latch: Grasps breast easily, tongue down, lips flanged, rhythmical sucking. Intervention(s): Waking techniques;Teach feeding cues Intervention(s): Breast massage  Audible Swallowing: Spontaneous and intermittent  Type of Nipple: Everted at rest and after stimulation  Comfort (Breast/Nipple): Soft / non-tender     Hold (Positioning): No assistance needed to correctly position infant at breast. Intervention(s): Breastfeeding basics reviewed  LATCH Score: 10  Lactation Tools Discussed/Used     Consult Status Consult Status: Complete    Huston FoleyMOULDEN, Lavell Supple S 05/04/2015, 11:20 AM

## 2015-05-04 NOTE — Discharge Instructions (Signed)
Contraception Choices Contraception (birth control) is the use of any methods or devices to prevent pregnancy. Below are some methods to help avoid pregnancy. HORMONAL METHODS   Contraceptive implant. This is a thin, plastic tube containing progesterone hormone. It does not contain estrogen hormone. Your health care provider inserts the tube in the inner part of the upper arm. The tube can remain in place for up to 3 years. After 3 years, the implant must be removed. The implant prevents the ovaries from releasing an egg (ovulation), thickens the cervical mucus to prevent sperm from entering the uterus, and thins the lining of the inside of the uterus.  Progesterone-only injections. These injections are given every 3 months by your health care provider to prevent pregnancy. This synthetic progesterone hormone stops the ovaries from releasing eggs. It also thickens cervical mucus and changes the uterine lining. This makes it harder for sperm to survive in the uterus.  Birth control pills. These pills contain estrogen and progesterone hormone. They work by preventing the ovaries from releasing eggs (ovulation). They also cause the cervical mucus to thicken, preventing the sperm from entering the uterus. Birth control pills are prescribed by a health care provider.Birth control pills can also be used to treat heavy periods.  Minipill. This type of birth control pill contains only the progesterone hormone. They are taken every day of each month and must be prescribed by your health care provider.  Birth control patch. The patch contains hormones similar to those in birth control pills. It must be changed once a week and is prescribed by a health care provider.  Vaginal ring. The ring contains hormones similar to those in birth control pills. It is left in the vagina for 3 weeks, removed for 1 week, and then a new one is put back in place. The patient must be comfortable inserting and removing the ring  from the vagina.A health care provider's prescription is necessary.  Emergency contraception. Emergency contraceptives prevent pregnancy after unprotected sexual intercourse. This pill can be taken right after sex or up to 5 days after unprotected sex. It is most effective the sooner you take the pills after having sexual intercourse. Most emergency contraceptive pills are available without a prescription. Check with your pharmacist. Do not use emergency contraception as your only form of birth control. BARRIER METHODS   Female condom. This is a thin sheath (latex or rubber) that is worn over the penis during sexual intercourse. It can be used with spermicide to increase effectiveness.  Female condom. This is a soft, loose-fitting sheath that is put into the vagina before sexual intercourse.  Diaphragm. This is a soft, latex, dome-shaped barrier that must be fitted by a health care provider. It is inserted into the vagina, along with a spermicidal jelly. It is inserted before intercourse. The diaphragm should be left in the vagina for 6 to 8 hours after intercourse.  Cervical cap. This is a round, soft, latex or plastic cup that fits over the cervix and must be fitted by a health care provider. The cap can be left in place for up to 48 hours after intercourse.  Sponge. This is a soft, circular piece of polyurethane foam. The sponge has spermicide in it. It is inserted into the vagina after wetting it and before sexual intercourse.  Spermicides. These are chemicals that kill or block sperm from entering the cervix and uterus. They come in the form of creams, jellies, suppositories, foam, or tablets. They do not require a  prescription. They are inserted into the vagina with an applicator before having sexual intercourse. The process must be repeated every time you have sexual intercourse. INTRAUTERINE CONTRACEPTION  Intrauterine device (IUD). This is a T-shaped device that is put in a woman's uterus  during a menstrual period to prevent pregnancy. There are 2 types:  Copper IUD. This type of IUD is wrapped in copper wire and is placed inside the uterus. Copper makes the uterus and fallopian tubes produce a fluid that kills sperm. It can stay in place for 10 years.  Hormone IUD. This type of IUD contains the hormone progestin (synthetic progesterone). The hormone thickens the cervical mucus and prevents sperm from entering the uterus, and it also thins the uterine lining to prevent implantation of a fertilized egg. The hormone can weaken or kill the sperm that get into the uterus. It can stay in place for 3-5 years, depending on which type of IUD is used. PERMANENT METHODS OF CONTRACEPTION  Female tubal ligation. This is when the woman's fallopian tubes are surgically sealed, tied, or blocked to prevent the egg from traveling to the uterus.  Hysteroscopic sterilization. This involves placing a small coil or insert into each fallopian tube. Your doctor uses a technique called hysteroscopy to do the procedure. The device causes scar tissue to form. This results in permanent blockage of the fallopian tubes, so the sperm cannot fertilize the egg. It takes about 3 months after the procedure for the tubes to become blocked. You must use another form of birth control for these 3 months.  Female sterilization. This is when the female has the tubes that carry sperm tied off (vasectomy).This blocks sperm from entering the vagina during sexual intercourse. After the procedure, the man can still ejaculate fluid (semen). NATURAL PLANNING METHODS  Natural family planning. This is not having sexual intercourse or using a barrier method (condom, diaphragm, cervical cap) on days the woman could become pregnant.  Calendar method. This is keeping track of the length of each menstrual cycle and identifying when you are fertile.  Ovulation method. This is avoiding sexual intercourse during ovulation.  Symptothermal  method. This is avoiding sexual intercourse during ovulation, using a thermometer and ovulation symptoms.  Post-ovulation method. This is timing sexual intercourse after you have ovulated. Regardless of which type or method of contraception you choose, it is important that you use condoms to protect against the transmission of sexually transmitted infections (STIs). Talk with your health care provider about which form of contraception is most appropriate for you.   This information is not intended to replace advice given to you by your health care provider. Make sure you discuss any questions you have with your health care provider.   Document Released: 12/24/2004 Document Revised: 12/29/2012 Document Reviewed: 06/18/2012 Elsevier Interactive Patient Education 2016 [employer]. Iron-Rich Diet Iron is a mineral that helps your body to produce hemoglobin. Hemoglobin is a protein in your red blood cells that carries oxygen to your body's tissues. Eating too little iron may cause you to feel weak and tired, and it can increase your risk for infection. Eating enough iron is necessary for your body's metabolism, muscle function, and nervous system. Iron is naturally found in many foods. It can also be added to foods or fortified in foods. There are two types of dietary iron:  Heme iron. Heme iron is absorbed by the body more easily than nonheme iron. Heme iron is found in meat, poultry, and fish.  Nonheme iron. Nonheme  iron is found in dietary supplements, iron-fortified grains, beans, and vegetables. You may need to follow an iron-rich diet if:  You have been diagnosed with iron deficiency or iron-deficiency anemia.  You have a condition that prevents you from absorbing dietary iron, such as:  Infection in your intestines.  Celiac disease. This involves long-lasting (chronic) inflammation of your intestines.  You do not eat enough iron.  You eat a diet that is high in foods that impair iron  absorption.  You have lost a lot of blood.  You have heavy bleeding during your menstrual cycle.  You are pregnant. WHAT IS MY PLAN? Your health care provider may help you to determine how much iron you need per day based on your condition. Generally, when a person consumes sufficient amounts of iron in the diet, the following iron needs are met:  Men.  38-59 years old: 11 mg per day.  46-33 years old: 8 mg per day.  Women.   65-48 years old: 15 mg per day.  94-25 years old: 18 mg per day.  Over 72 years old: 8 mg per day.  Pregnant women: 27 mg per day.  Breastfeeding women: 9 mg per day. WHAT DO I NEED TO KNOW ABOUT AN IRON-RICH DIET?  Eat fresh fruits and vegetables that are high in vitamin C along with foods that are high in iron. This will help increase the amount of iron that your body absorbs from food, especially with foods containing nonheme iron. Foods that are high in vitamin C include oranges, peppers, tomatoes, and mango.  Take iron supplements only as directed by your health care provider. Overdose of iron can be life-threatening. If you were prescribed iron supplements, take them with orange juice or a vitamin C supplement.  Cook foods in pots and pans that are made from iron.   Eat nonheme iron-containing foods alongside foods that are high in heme iron. This helps to improve your iron absorption.   Certain foods and drinks contain compounds that impair iron absorption. Avoid eating these foods in the same meal as iron-rich foods or with iron supplements. These include:  Coffee, black tea, and red wine.  Milk, dairy products, and foods that are high in calcium.  Beans, soybeans, and peas.  Whole grains.  When eating foods that contain both nonheme iron and compounds that impair iron absorption, follow these tips to absorb iron better.   Soak beans overnight before cooking.  Soak whole grains overnight and drain them before using.  Ferment  flours before baking, such as using yeast in bread dough. WHAT FOODS CAN I EAT? Grains Iron-fortified breakfast cereal. Iron-fortified whole-wheat bread. Enriched rice. Sprouted grains. Vegetables Spinach. Potatoes with skin. Green peas. Broccoli. Red and green bell peppers. Fermented vegetables. Fruits Prunes. Raisins. Oranges. Strawberries. Mango. Grapefruit. Meats and Other Protein Sources Beef liver. Oysters. Beef. Shrimp. [place]. Chicken. [place]. Sardines. Chickpeas. Nuts. Tofu. Beverages Tomato juice. Fresh orange juice. Prune juice. Hibiscus tea. Fortified instant breakfast shakes. Condiments Tahini. Fermented soy sauce. Sweets and Desserts Black-strap molasses.  Other Wheat germ. The items listed above may not be a complete list of recommended foods or beverages. Contact your dietitian for more options. WHAT FOODS ARE NOT RECOMMENDED? Grains Whole grains. Bran cereal. Bran flour. Oats. Vegetables Artichokes. Brussels sprouts. Kale. Fruits Blueberries. Raspberries. Strawberries. Figs. Meats and Other Protein Sources Soybeans. Products made from soy protein. Dairy Milk. Cream. Cheese. Yogurt. Cottage cheese. Beverages Coffee. Black tea. Red wine. Sweets and Desserts Cocoa. Chocolate. Ice  cream. Other Basil. Oregano. Parsley. The items listed above may not be a complete list of foods and beverages to avoid. Contact your dietitian for more information.   This information is not intended to replace advice given to you by your health care provider. Make sure you discuss any questions you have with your health care provider.   Document Released: 08/07/2004 Document Revised: 01/14/2014 Document Reviewed: 07/21/2013 Elsevier Interactive Patient Education [employer].

## 2015-07-17 ENCOUNTER — Encounter: Payer: Self-pay | Admitting: Infectious Disease

## 2015-07-17 ENCOUNTER — Ambulatory Visit (INDEPENDENT_AMBULATORY_CARE_PROVIDER_SITE_OTHER): Payer: Medicaid Other | Admitting: Infectious Disease

## 2015-07-17 DIAGNOSIS — B181 Chronic viral hepatitis B without delta-agent: Secondary | ICD-10-CM | POA: Diagnosis not present

## 2015-07-17 LAB — COMPLETE METABOLIC PANEL WITH GFR
ALT: 21 U/L (ref 6–29)
AST: 20 U/L (ref 10–30)
Albumin: 4 g/dL (ref 3.6–5.1)
Alkaline Phosphatase: 67 U/L (ref 33–115)
BILIRUBIN TOTAL: 0.5 mg/dL (ref 0.2–1.2)
BUN: 10 mg/dL (ref 7–25)
CHLORIDE: 105 mmol/L (ref 98–110)
CO2: 26 mmol/L (ref 20–31)
CREATININE: 0.73 mg/dL (ref 0.50–1.10)
Calcium: 9.2 mg/dL (ref 8.6–10.2)
GFR, Est African American: >89 mL/min (ref >=60)
GFR, Est Non African American: >89 mL/min (ref >=60)
GLUCOSE: 98 mg/dL (ref 65–99)
Potassium: 3.7 mmol/L (ref 3.5–5.3)
SODIUM: 141 mmol/L (ref 135–146)
TOTAL PROTEIN: 6.5 g/dL (ref 6.1–8.1)

## 2015-07-17 NOTE — Progress Notes (Signed)
Chief complaint: follow-up for chronic hepatitis B without delta agent\  Subjective:    Patient ID: Michelle Logan, female    DOB: November 28, 1980, 35 y.o.   MRN: [id number]  HPI   35 year old [place] lady who was diagnosed with Hepatitis B with pregnancy labs. She tested negative for HIV. Her Hep B DNA was only a few hundred copies. Her LFT's were normal. She is hep E ag antibody POSITIVE. She had baby without complications and returns for follouwp.   Past Medical History  Diagnosis Date  . Medical history non-contributory   . Chronic hepatitis B without delta agent without hepatic coma (HCC) 12/21/2014  . Pregnancy and infectious disease 12/21/2014  . Thyroid disease during pregnancy 12/21/2014  . UTI (urinary tract infection) 12/21/2014    Past Surgical History  Procedure Laterality Date  . No past surgeries      No family history on file.    Social History   Social History  . Marital Status: Married    Spouse Name: N/A  . Number of Children: N/A  . Years of Education: N/A   Social History Main Topics  . Smoking status: Never Smoker   . Smokeless tobacco: Never Used  . Alcohol Use: No  . Drug Use: No  . Sexual Activity:    Partners: Male   Other Topics Concern  . None   Social History Narrative    No Known Allergies   Current outpatient prescriptions:  .  ferrous sulfate 325 (65 FE) MG tablet, Take 1 tablet (325 mg total) by mouth 2 (two) times daily with a meal. For 14 days, then once daily for 28 days., Disp: 45 tablet, Rfl: 1 .  ibuprofen (ADVIL,MOTRIN) 600 MG tablet, Take 1 tablet (600 mg total) by mouth every 6 (six) hours., Disp: 30 tablet, Rfl: 2 .  methimazole (TAPAZOLE) 5 MG tablet, Take 5 mg by mouth daily., Disp: , Rfl:  .  Prenatal Vit-Fe Fumarate-FA (PRENATAL MULTIVITAMIN) TABS tablet, Take 1 tablet by mouth daily at 12 noon., Disp: , Rfl:     Review of Systems  Constitutional: Negative for fever, chills, diaphoresis, activity change,  appetite change, fatigue and unexpected weight change.  HENT: Negative for congestion, rhinorrhea, sinus pressure, sneezing, sore throat and trouble swallowing.   Eyes: Negative for photophobia and visual disturbance.  Respiratory: Negative for cough, chest tightness, shortness of breath, wheezing and stridor.   Cardiovascular: Negative for chest pain, palpitations and leg swelling.  Gastrointestinal: Positive for nausea. Negative for vomiting, abdominal pain, diarrhea, constipation, blood in stool, abdominal distention and anal bleeding.  Genitourinary: Negative for dysuria, hematuria, flank pain and difficulty urinating.  Musculoskeletal: Negative for myalgias, back pain, joint swelling, arthralgias and gait problem.  Skin: Negative for color change, pallor, rash and wound.  Neurological: Negative for dizziness, tremors, weakness and light-headedness.  Hematological: Negative for adenopathy. Does not bruise/bleed easily.  Psychiatric/Behavioral: Negative for behavioral problems, confusion, sleep disturbance, dysphoric mood, decreased concentration and agitation.       Objective:   Physical Exam  Constitutional: She is oriented to person, place, and time. She appears well-developed and well-nourished. No distress.  HENT:  Head: Normocephalic and atraumatic.  Mouth/Throat: No oropharyngeal exudate.  Eyes: Conjunctivae and EOM are normal. No scleral icterus.  Neck: Normal range of motion. Neck supple.  Cardiovascular: Normal rate and regular rhythm.   Pulmonary/Chest: Effort normal. No respiratory distress. She has no wheezes.  Abdominal: Soft. Bowel sounds are normal. She exhibits no distension. There  is no tenderness. There is no rebound.  Musculoskeletal: She exhibits no edema or tenderness.  Neurological: She is alert and oriented to person, place, and time. She exhibits normal muscle tone. Coordination normal.  Skin: Skin is warm and dry. No rash noted. She is not diaphoretic. No  erythema. No pallor.  Psychiatric: She has a normal mood and affect. Her behavior is normal. Judgment and thought content normal.          Assessment & Plan:   Chronic hepatitis B without delta agent without coma or cirrhosis: check labs today and in 6 months and may then change to yearly  She will also eventually need screening for  [facility] once past age of 35      Acey Lavornelius Van Dam, MD

## 2015-07-18 LAB — HEPATITIS C ANTIBODY: HCV Ab: NEGATIVE

## 2015-07-21 LAB — HEPATITIS B DNA, ULTRAQUANTITATIVE, PCR
HEPATITIS B DNA (CALC): 3.17 {Log_IU}/mL — AB (ref <1.30)
HEPATITIS B DNA: 1493 [IU]/mL — AB (ref <20)

## 2016-01-18 ENCOUNTER — Ambulatory Visit: Payer: Medicaid Other | Admitting: Infectious Disease

## 2016-04-24 ENCOUNTER — Ambulatory Visit: Payer: Medicaid Other | Admitting: Infectious Disease

## 2016-10-18 ENCOUNTER — Encounter: Payer: Self-pay | Admitting: *Deleted

## 2016-11-20 ENCOUNTER — Other Ambulatory Visit (HOSPITAL_COMMUNITY)
Admission: RE | Admit: 2016-11-20 | Discharge: 2016-11-20 | Disposition: A | Discharge status: Home / Self Care | Payer: Medicaid Other | Source: Ambulatory Visit | Attending: Advanced Practice Midwife | Admitting: Advanced Practice Midwife

## 2016-11-20 ENCOUNTER — Encounter: Payer: Self-pay | Admitting: Advanced Practice Midwife

## 2016-11-20 ENCOUNTER — Ambulatory Visit (INDEPENDENT_AMBULATORY_CARE_PROVIDER_SITE_OTHER): Payer: Medicaid Other | Admitting: Advanced Practice Midwife

## 2016-11-20 VITALS — BP 111/51 | HR 87 | Wt 138.2 lb

## 2016-11-20 DIAGNOSIS — O219 Vomiting of pregnancy, unspecified: Secondary | ICD-10-CM | POA: Diagnosis not present

## 2016-11-20 DIAGNOSIS — O99282 Endocrine, nutritional and metabolic diseases complicating pregnancy, second trimester: Secondary | ICD-10-CM | POA: Diagnosis not present

## 2016-11-20 DIAGNOSIS — Z331 Pregnant state, incidental: Secondary | ICD-10-CM | POA: Diagnosis not present

## 2016-11-20 DIAGNOSIS — O99281 Endocrine, nutritional and metabolic diseases complicating pregnancy, first trimester: Principal | ICD-10-CM

## 2016-11-20 DIAGNOSIS — Z23 Encounter for immunization: Secondary | ICD-10-CM

## 2016-11-20 DIAGNOSIS — B181 Chronic viral hepatitis B without delta-agent: Secondary | ICD-10-CM | POA: Diagnosis not present

## 2016-11-20 DIAGNOSIS — O98412 Viral hepatitis complicating pregnancy, second trimester: Secondary | ICD-10-CM

## 2016-11-20 DIAGNOSIS — O0990 Supervision of high risk pregnancy, unspecified, unspecified trimester: Secondary | ICD-10-CM

## 2016-11-20 DIAGNOSIS — O98411 Viral hepatitis complicating pregnancy, first trimester: Secondary | ICD-10-CM

## 2016-11-20 DIAGNOSIS — O0992 Supervision of high risk pregnancy, unspecified, second trimester: Secondary | ICD-10-CM

## 2016-11-20 DIAGNOSIS — Z113 Encounter for screening for infections with a predominantly sexual mode of transmission: Secondary | ICD-10-CM

## 2016-11-20 DIAGNOSIS — E079 Disorder of thyroid, unspecified: Secondary | ICD-10-CM

## 2016-11-20 LAB — POCT URINALYSIS DIP (DEVICE)
Bilirubin Urine: NEGATIVE
Glucose, UA: NEGATIVE mg/dL
HGB URINE DIPSTICK: NEGATIVE
Nitrite: NEGATIVE
PROTEIN: NEGATIVE mg/dL
Specific Gravity, Urine: 1.02 (ref 1.005–1.030)
UROBILINOGEN UA: 1 mg/dL (ref 0.0–1.0)
pH: 7 (ref 5.0–8.0)

## 2016-11-20 MED ORDER — DOXYLAMINE-PYRIDOXINE 10-10 MG PO TBEC: 1.0000 | DELAYED_RELEASE_TABLET | Freq: Three times a day (TID) | ORAL | 3 refills | Status: DC | PRN

## 2016-11-20 MED ORDER — VITAFOL ULTRA 29-0.6-0.4-200 MG PO CAPS: 1.0000 | ORAL_CAPSULE | Freq: Every day | ORAL | 12 refills | Status: DC

## 2016-11-20 NOTE — Progress Notes (Signed)
enza

## 2016-11-21 ENCOUNTER — Encounter: Payer: Self-pay | Admitting: Advanced Practice Midwife

## 2016-11-21 LAB — THYROID PANEL WITH TSH
Free Thyroxine Index: 2.2 (ref 1.2–4.9)
T3 Uptake Ratio: 19 % — ABNORMAL LOW (ref 24–39)
T4, Total: 11.7 ug/dL (ref 4.5–12.0)
TSH: 0.165 u[IU]/mL — AB (ref 0.450–4.500)

## 2016-11-21 LAB — COMPREHENSIVE METABOLIC PANEL
A/G RATIO: 1.6 (ref 1.2–2.2)
ALBUMIN: 4.2 g/dL (ref 3.5–5.5)
ALK PHOS: 53 IU/L (ref 39–117)
ALT: 26 IU/L (ref 0–32)
AST: 20 IU/L (ref 0–40)
BUN/Creatinine Ratio: 12 (ref 9–23)
BUN: 6 mg/dL (ref 6–20)
Bilirubin Total: <0.2 mg/dL (ref 0.0–1.2)
CALCIUM: 9.5 mg/dL (ref 8.7–10.2)
CHLORIDE: 100 mmol/L (ref 96–106)
CO2: 23 mmol/L (ref 20–29)
Creatinine, Ser: 0.52 mg/dL — ABNORMAL LOW (ref 0.57–1.00)
GFR calc Af Amer: 144 mL/min/{1.73_m2} (ref >59)
GFR, EST NON AFRICAN AMERICAN: 125 mL/min/{1.73_m2} (ref >59)
GLOBULIN, TOTAL: 2.6 g/dL (ref 1.5–4.5)
Glucose: 68 mg/dL (ref 65–99)
POTASSIUM: 4.2 mmol/L (ref 3.5–5.2)
Sodium: 138 mmol/L (ref 134–144)
Total Protein: 6.8 g/dL (ref 6.0–8.5)

## 2016-11-21 LAB — HEMOGLOBINOPATHY EVALUATION
HEMOGLOBIN A2 QUANTITATION: 2.8 % (ref 1.8–3.2)
HGB C: 0 %
HGB S: 0 %
HGB VARIANT: 0 %
Hemoglobin F Quantitation: 0.5 % (ref 0.0–2.0)
Hgb A: 96.7 % (ref 96.4–98.8)

## 2016-11-21 LAB — HIV ANTIBODY (ROUTINE TESTING W REFLEX): HIV SCREEN 4TH GENERATION: NONREACTIVE

## 2016-11-21 LAB — HEPATITIS B DNA, ULTRAQUANTITATIVE, PCR
HBV DNA SERPL PCR-ACNC: 3260 [IU]/mL
HBV DNA SERPL PCR-LOG IU: 3.513 {Log_IU}/mL

## 2016-11-21 LAB — GC/CHLAMYDIA PROBE AMP (~~LOC~~) NOT AT ARMC
Chlamydia: NEGATIVE
Neisseria Gonorrhea: NEGATIVE

## 2016-11-21 NOTE — Progress Notes (Signed)
  Subjective:    Michelle Logan is being seen today for her first obstetrical visit.  This is a planned pregnancy. She is at 85w3dgestation. Her obstetrical history is significant for Hx Chronic Hep B, Hx hyperthyroidism. Not on meds now per endocrinology . Patient does intend to breast feed. Pregnancy history fully reviewed.  Patient reports no complaints.  [facility]. Will request records.   Hx Chronic Hep B. Had been followed by ID, but hasn't been back on over a year.  Hx Hyperthyroidism. Has been on Methimazole in the past, but states her endocrinologist took her off "a while ago" because she didn't need it any more. Hasn't been back for F/U.   Review of Systems:   Review of Systems  Constitutional: Negative for chills and fever.  Gastrointestinal: Positive for nausea and vomiting. Negative for abdominal pain and diarrhea.  Genitourinary: Negative for dysuria, vaginal bleeding and vaginal discharge.    Objective:     BP (!) 111/51   Pulse 87   Wt 138 lb 3.2 oz (62.7 kg)   LMP 08/19/2016   BMI 23.72 kg/m  Physical Exam  Nursing note and vitals reviewed. Constitutional: She is oriented to person, place, and time. She appears well-developed and well-nourished. No distress.  Eyes: No scleral icterus.  Cardiovascular: Normal rate, regular rhythm and normal heart sounds.  Respiratory: Effort normal and breath sounds normal.  GI: Soft. There is no tenderness.  FH 2/SP  Genitourinary:  Genitourinary Comments: deferred  Musculoskeletal: She exhibits no edema.  Neurological: She is alert and oriented to person, place, and time. She has normal reflexes.  Skin: Skin is warm and dry.  Psychiatric: She has a normal mood and affect.      Fetal Exam Fetal Monitor Review: Mode: hand-held doppler probe.   Baseline rate: 152.         Assessment:    Pregnancy: G2P1001   1. Thyroid disease during pregnancy in first trimester - Encouraged to F?U w/  Endocrinology - Comp Met (CMET) - Hepatitis B DNA, ultraquantitative, PCR - Thyroid Panel With TSH  2. Chronic hepatitis B affecting antepartum care of mother in first trimester ([facility] - F/U w/ ID. Pt will call.  3. Supervision of high risk pregnancy, antepartum  - [facility]- HIV antibody - Hemoglobinopathy evaluation - Prenat-Fe Poly-Methfol-FA-DHA (VITAFOL ULTRA) 29-0.6-0.4-200 MG CAPS; Take 1 tablet daily by mouth.  Dispense: 30 capsule; Refill: 12 - Culture, OB Urine - Cystic Fibrosis Mutation 97 - Genetic Screening - SMN1 Copy Number Analysis - Flu Vaccine QUAD 36+ mos IM  4. Nausea/vomiting in pregnancy  - Doxylamine-Pyridoxine 10-10 MG TBEC; Take 1-2 tablets 3 (three) times daily as needed by mouth (nausea). Start with 2 tablets every evening. If symptoms persist, add 1 tablet every morning. If symptoms persist, add 1 tablet at mid-day.  Dispense: 60 tablet; Refill: 3   Plan:     Initial labs drawn. Prenatal vitamins. Problem list reviewed and updated. NIPS drawn Role of ultrasound in pregnancy discussed; fetal survey: requested. Amniocentesis discussed: not indicated. Follow up in 4 weeks.   VManya Silvas11/15/2018

## 2016-11-22 LAB — URINE CULTURE, OB REFLEX: ORGANISM ID, BACTERIA: NO GROWTH

## 2016-11-22 LAB — CULTURE, OB URINE

## 2016-11-25 LAB — SMN1 COPY NUMBER ANALYSIS (SMA CARRIER SCREENING)

## 2016-12-01 ENCOUNTER — Encounter (HOSPITAL_COMMUNITY): Payer: Self-pay | Admitting: Advanced Practice Midwife

## 2016-12-02 ENCOUNTER — Telehealth: Payer: Self-pay | Admitting: General Practice

## 2016-12-02 LAB — CYSTIC FIBROSIS MUTATION 97: Interpretation: NOT DETECTED

## 2016-12-02 NOTE — Telephone Encounter (Signed)
-----   Message from Virginia Smith, CNM sent at 12/01/2016  8:38 PM EST ----- Pt has Hx Hyperthyroidism and is off meds. TSH is low, but will need to draw Free T4 and T3 to determine if pt needs to go back on meds per Up-to-date and Dr. Constant. Please ask if she can come for labs visit before next appt. 

## 2016-12-04 NOTE — Telephone Encounter (Signed)
-----   Message from [place] Smith, [place] sent at 12/01/2016  8:38 PM EST ----- Pt has Hx Hyperthyroidism and is off meds. TSH is low, but will need to draw Free T4 and T3 to determine if pt needs to go back on meds per Up-to-date and Dr. Jolayne Pantheronstant. Please ask if she can come for labs visit before next appt.

## 2016-12-04 NOTE — Telephone Encounter (Signed)
Called patient & informed her of results & need to come in for additional testing. Patient verbalized understanding and states she will come Friday 11/30 @ 11am. Patient had no questions

## 2016-12-06 ENCOUNTER — Other Ambulatory Visit: Payer: Medicaid Other

## 2016-12-06 DIAGNOSIS — E0590 Thyrotoxicosis, unspecified without thyrotoxic crisis or storm: Secondary | ICD-10-CM

## 2016-12-07 LAB — T4, FREE: Free T4: 1.05 ng/dL (ref 0.82–1.77)

## 2016-12-07 LAB — T3: T3, Total: 186 ng/dL — ABNORMAL HIGH (ref 71–180)

## 2016-12-11 ENCOUNTER — Encounter: Payer: Self-pay | Admitting: Infectious Disease

## 2016-12-11 ENCOUNTER — Ambulatory Visit (INDEPENDENT_AMBULATORY_CARE_PROVIDER_SITE_OTHER): Payer: Self-pay | Admitting: Infectious Disease

## 2016-12-11 VITALS — BP 109/67 | HR 93 | Temp 98.4°F | Wt 145.0 lb

## 2016-12-11 DIAGNOSIS — B181 Chronic viral hepatitis B without delta-agent: Secondary | ICD-10-CM

## 2016-12-11 DIAGNOSIS — E0590 Thyrotoxicosis, unspecified without thyrotoxic crisis or storm: Secondary | ICD-10-CM

## 2016-12-11 DIAGNOSIS — O99280 Endocrine, nutritional and metabolic diseases complicating pregnancy, unspecified trimester: Secondary | ICD-10-CM

## 2016-12-11 DIAGNOSIS — O98912 Unspecified maternal infectious and parasitic disease complicating pregnancy, second trimester: Secondary | ICD-10-CM

## 2016-12-11 NOTE — Progress Notes (Signed)
Chief complaint: follow-up for chronic hepatitis B without delta agent in pregnant lady  Subjective:    Patient ID: Michelle Logan, female    DOB: 1980/12/19, 36 y.o.   MRN: [id number]  HPI  36 year old [place] lady who was diagnosed with Hepatitis B with pregnancy labs. She tested negative for HIV. Her Hep B DNA was only a few hundred copies. Her LFT's were normal. She was hep E ag antibody POSITIVE. She had baby without complications and we did not initiate treatment for HBV.   She is now pregnant again and had labs done with OB which show HIV -, HBV DNA in low counts, normal LFTs.  She is without any complaints today.     Past Medical History:  Diagnosis Date  . Chronic hepatitis B without delta agent without hepatic coma (HCC) 12/21/2014  . Medical history non-contributory   . Pregnancy and infectious disease 12/21/2014  . Thyroid disease during pregnancy 12/21/2014  . UTI (urinary tract infection) 12/21/2014    Past Surgical History:  Procedure Laterality Date  . NO PAST SURGERIES      No family history on file.    Social History   Socioeconomic History  . Marital status: Married    Spouse name: None  . Number of children: None  . Years of education: None  . Highest education level: None  Social Needs  . Financial resource strain: None  . Food insecurity - worry: None  . Food insecurity - inability: None  . Transportation needs - medical: None  . Transportation needs - non-medical: None  Occupational History  . None  Tobacco Use  . Smoking status: Never Smoker  . Smokeless tobacco: Never Used  Substance and Sexual Activity  . Alcohol use: No    Alcohol/week: 0.0 oz  . Drug use: No  . Sexual activity: Yes    Partners: Male    Birth control/protection: None  Other Topics Concern  . None  Social History Narrative  . None    No Known Allergies   Current Outpatient Medications:  .  Doxylamine-Pyridoxine 10-10 MG TBEC, Take 1-2 tablets 3  (three) times daily as needed by mouth (nausea). Start with 2 tablets every evening. If symptoms persist, add 1 tablet every morning. If symptoms persist, add 1 tablet at mid-day., Disp: 60 tablet, Rfl: 3 .  ferrous sulfate 325 (65 FE) MG tablet, Take 1 tablet (325 mg total) by mouth 2 (two) times daily with a meal. For 14 days, then once daily for 28 days., Disp: 45 tablet, Rfl: 1 .  ibuprofen (ADVIL,MOTRIN) 600 MG tablet, Take 1 tablet (600 mg total) by mouth every 6 (six) hours., Disp: 30 tablet, Rfl: 2 .  methimazole (TAPAZOLE) 5 MG tablet, Take 5 mg by mouth daily., Disp: , Rfl:  .  Prenat-Fe Poly-Methfol-FA-DHA (VITAFOL ULTRA) 29-0.6-0.4-200 MG CAPS, Take 1 tablet daily by mouth., Disp: 30 capsule, Rfl: 12 .  Prenatal Vit-Fe Fumarate-FA (PRENATAL MULTIVITAMIN) TABS tablet, Take 1 tablet by mouth daily at 12 noon., Disp: , Rfl:     Review of Systems  Constitutional: Negative for activity change, appetite change, chills, diaphoresis, fatigue, fever and unexpected weight change.  HENT: Negative for congestion, rhinorrhea, sinus pressure, sneezing, sore throat and trouble swallowing.   Eyes: Negative for photophobia and visual disturbance.  Respiratory: Negative for cough, chest tightness, shortness of breath, wheezing and stridor.   Cardiovascular: Negative for chest pain, palpitations and leg swelling.  Gastrointestinal: Negative for abdominal distention, abdominal pain,  anal bleeding, blood in stool, constipation, diarrhea, nausea and vomiting.  Genitourinary: Negative for difficulty urinating, dysuria, flank pain and hematuria.  Musculoskeletal: Negative for arthralgias, back pain, gait problem, joint swelling and myalgias.  Skin: Negative for color change, pallor, rash and wound.  Neurological: Negative for dizziness, tremors, weakness and light-headedness.  Hematological: Negative for adenopathy. Does not bruise/bleed easily.  Psychiatric/Behavioral: Negative for agitation, behavioral  problems, confusion, decreased concentration, dysphoric mood and sleep disturbance.       Objective:   Physical Exam  Constitutional: She is oriented to person, place, and time. She appears well-developed and well-nourished. No distress.  HENT:  Head: Normocephalic and atraumatic.  Mouth/Throat: No oropharyngeal exudate.  Eyes: Conjunctivae and EOM are normal. No scleral icterus.  Neck: Normal range of motion. Neck supple.  Cardiovascular: Normal rate and regular rhythm.  Pulmonary/Chest: Effort normal. No respiratory distress. She has no wheezes.  Abdominal: Soft. Bowel sounds are normal. There is no tenderness.  Musculoskeletal: She exhibits no edema or tenderness.  Neurological: She is alert and oriented to person, place, and time. She exhibits normal muscle tone. Coordination normal.  Skin: Skin is warm and dry. No rash noted. She is not diaphoretic. No erythema. No pallor.  Psychiatric: She has a normal mood and affect. Her behavior is normal. Judgment and thought content normal.          Assessment & Plan:   Chronic hepatitis B without delta agent without coma or cirrhosis: rtc in 3 months and recheck LFTs and HBV DNA. I doubt she will need treatment during this pregnancy either but we will see. Certainly standard post delivery vaccination protocols should be followed for HBV + mother in child regardless. [facility] either. She has had her flu vaccine.      Acey Lavornelius Van Dam, MD

## 2016-12-18 ENCOUNTER — Ambulatory Visit (INDEPENDENT_AMBULATORY_CARE_PROVIDER_SITE_OTHER): Payer: Self-pay | Admitting: Obstetrics and Gynecology

## 2016-12-18 ENCOUNTER — Encounter: Payer: Self-pay | Admitting: Family Medicine

## 2016-12-18 ENCOUNTER — Encounter: Payer: Self-pay | Admitting: Obstetrics and Gynecology

## 2016-12-18 VITALS — BP 104/62 | HR 83 | Wt 144.8 lb

## 2016-12-18 DIAGNOSIS — O99280 Endocrine, nutritional and metabolic diseases complicating pregnancy, unspecified trimester: Secondary | ICD-10-CM

## 2016-12-18 DIAGNOSIS — E0590 Thyrotoxicosis, unspecified without thyrotoxic crisis or storm: Secondary | ICD-10-CM

## 2016-12-18 DIAGNOSIS — O0990 Supervision of high risk pregnancy, unspecified, unspecified trimester: Secondary | ICD-10-CM

## 2016-12-18 DIAGNOSIS — B181 Chronic viral hepatitis B without delta-agent: Secondary | ICD-10-CM

## 2016-12-18 NOTE — Progress Notes (Signed)
   PRENATAL VISIT NOTE  Subjective:  Michelle Logan is a 36 y.o. G2P1001 at 5154w2d being seen today for ongoing prenatal care.  She is currently monitored for the following issues for this high-risk pregnancy and has Chronic hepatitis B without delta agent without hepatic coma (HCC); Pregnancy and infectious disease; Hyperthyroidism affecting pregnancy, antepartum; and Supervision of high risk pregnancy, antepartum on their problem list.  Patient reports no complaints.  Contractions: Not present. Vag. Bleeding: None.   . Denies leaking of fluid.   The following portions of the patient's history were reviewed and updated as appropriate: allergies, current medications, past family history, past medical history, past social history, past surgical history and problem list. Problem list updated.  Objective:   Vitals:   12/18/16 1001  BP: 104/62  Pulse: 83  Weight: 144 lb 12.8 oz (65.7 kg)    Fetal Status: Fetal Heart Rate (bpm): 155         General:  Alert, oriented and cooperative. Patient is in no acute distress.  Skin: Skin is warm and dry. No rash noted.   Cardiovascular: Normal heart rate noted  Respiratory: Normal respiratory effort, no problems with respiration noted  Abdomen: Soft, gravid, appropriate for gestational age.  Pain/Pressure: Absent     Pelvic: Cervical exam deferred        Extremities: Normal range of motion.  Edema: None  Mental Status:  Normal mood and affect. Normal behavior. Normal judgment and thought content.   Assessment and Plan:  Pregnancy: G2P1001 at 5154w2d  1. Supervision of high risk pregnancy, antepartum Patient is doing well without complaints Anatomy ultrasound ordered with health department- 12/31 at 10 am AFP today   2. Hyperthyroidism affecting pregnancy, antepartum History of hyperthyroid Normal labs  3. Chronic hepatitis B without delta agent without hepatic coma (HCC) Currently stable Follow up with ID in March for repeat blood  work  Preterm labor symptoms and general obstetric precautions including but not limited to vaginal bleeding, contractions, leaking of fluid and fetal movement were reviewed in detail with the patient. Please refer to After Visit Summary for other counseling recommendations.  Return in about 4 weeks (around 01/15/2017) for ROB.   Catalina AntiguaPeggy Rani Idler, MD

## 2016-12-18 NOTE — Progress Notes (Signed)
[facility].  Pt notified and given directions/telephone # on how to get to location.

## 2016-12-21 LAB — AFP, SERUM, OPEN SPINA BIFIDA
AFP MoM: 0.65
AFP Value: 28 ng/mL
GEST. AGE ON COLLECTION DATE: 17.3 wk
MATERNAL AGE AT EDD: 35.1 a
OSBR Risk 1 IN: 10000
Test Results:: NEGATIVE
Weight: 144 [lb_av]

## 2017-01-06 ENCOUNTER — Other Ambulatory Visit: Payer: Self-pay | Admitting: *Deleted

## 2017-01-06 DIAGNOSIS — O0992 Supervision of high risk pregnancy, unspecified, second trimester: Secondary | ICD-10-CM

## 2017-01-07 NOTE — L&D Delivery Note (Addendum)
Delivery Note At 10:38 AM a viable and healthy female was delivered via Vaginal, Spontaneous (Presentation: cephalic;.LOA   ).  APGAR: 8, 9; Placenta status: spontaneous, intact .  Cord: 3 vessel Complications: Large amount of meconium stained fluid on delivery  Anesthesia:  IV pain meds Episiotomy:  none Lacerations: 1st degree;Perineal Suture Repair: 3.0 monocryl on sh Est. Blood Loss (mL):  200  Mom to postpartum.  Baby to Couplet care / Skin to Skin.  Myrene Buddy 05/20/2017, 11:09 AM

## 2017-01-09 ENCOUNTER — Telehealth: Payer: Self-pay | Admitting: *Deleted

## 2017-01-09 NOTE — Telephone Encounter (Signed)
Pt scheduled for detailed anatomy u/s in [facility] on 1/9. I was unable to get preauthorization through medicaid because it is "termed", expired 01/06/17. I have cancelled her mfm app and rescheduled her at [place] for 01/13/17 @ 1000. Attempted to call patient, got vm. Message left stating I had to change her appt, please return my call.

## 2017-01-15 ENCOUNTER — Ambulatory Visit (INDEPENDENT_AMBULATORY_CARE_PROVIDER_SITE_OTHER): Payer: Medicaid Other | Admitting: Obstetrics and Gynecology

## 2017-01-15 ENCOUNTER — Other Ambulatory Visit (HOSPITAL_COMMUNITY): Payer: Medicaid Other

## 2017-01-15 VITALS — BP 99/60 | HR 91 | Wt 153.4 lb

## 2017-01-15 DIAGNOSIS — O0990 Supervision of high risk pregnancy, unspecified, unspecified trimester: Secondary | ICD-10-CM

## 2017-01-15 DIAGNOSIS — E0590 Thyrotoxicosis, unspecified without thyrotoxic crisis or storm: Secondary | ICD-10-CM

## 2017-01-15 DIAGNOSIS — O09523 Supervision of elderly multigravida, third trimester: Secondary | ICD-10-CM | POA: Insufficient documentation

## 2017-01-15 DIAGNOSIS — O98912 Unspecified maternal infectious and parasitic disease complicating pregnancy, second trimester: Secondary | ICD-10-CM

## 2017-01-15 DIAGNOSIS — O99280 Endocrine, nutritional and metabolic diseases complicating pregnancy, unspecified trimester: Secondary | ICD-10-CM

## 2017-01-15 DIAGNOSIS — O09522 Supervision of elderly multigravida, second trimester: Secondary | ICD-10-CM

## 2017-01-15 DIAGNOSIS — B181 Chronic viral hepatitis B without delta-agent: Secondary | ICD-10-CM

## 2017-01-15 MED ORDER — PROMETHAZINE HCL 25 MG PO TABS
25.0000 mg | ORAL_TABLET | Freq: Four times a day (QID) | ORAL | 2 refills | Status: DC | PRN
Start: 2017-01-15 — End: 2017-04-23

## 2017-01-15 NOTE — Progress Notes (Signed)
Prenatal Visit Note Date: 01/15/2017 Clinic: [facility]  Subjective:  Michelle Logan is a 37 y.o. G2P1001 at 5268w2d being seen today for ongoing prenatal care.  She is currently monitored for the following issues for this high-risk pregnancy and has Chronic hepatitis B without delta agent without hepatic coma (HCC); Pregnancy and infectious disease; Hyperthyroidism affecting pregnancy, antepartum; Supervision of high risk pregnancy, antepartum; and AMA (advanced maternal age) multigravida 35+, second trimester on their problem list.  Patient reports no complaints.   Contractions: Not present. Vag. Bleeding: None.  Movement: Present. Denies leaking of fluid.   The following portions of the patient's history were reviewed and updated as appropriate: allergies, current medications, past family history, past medical history, past social history, past surgical history and problem list. Problem list updated.  Objective:   Vitals:   01/15/17 0827  BP: 99/60  Pulse: 91  Weight: 153 lb 6.4 oz (69.6 kg)    Fetal Status: Fetal Heart Rate (bpm): 153   Movement: Present     General:  Alert, oriented and cooperative. Patient is in no acute distress.  Skin: Skin is warm and dry. No rash noted.   Cardiovascular: Normal heart rate noted  Respiratory: Normal respiratory effort, no problems with respiration noted  Abdomen: Soft, gravid, appropriate for gestational age. Pain/Pressure: Absent     Pelvic:  Cervical exam deferred        Extremities: Normal range of motion.  Edema: None  Mental Status: Normal mood and affect. Normal behavior. Normal judgment and thought content.   Urinalysis:      Assessment and Plan:  Pregnancy: G2P1001 at 4168w2d  1. Infectious disease in mother during second trimester of pregnancy See below  2. Supervision of high risk pregnancy, antepartum Anatomy u/s scheduled. Pt has medicaid now - US MFM OB DETAIL +14 WK; Future - Hepatitis C Antibody -  Obstetric Panel, Including HIV  3. Hyperthyroidism affecting pregnancy, antepartum On no meds. F/u rpt ft4  4. Chronic hepatitis B without delta agent without hepatic coma (HCC) Has ID f/u in march. Had quant in november  5. AMA (advanced maternal age) multigravida 35+, second trimester S/p low risk panorama. F/u anatomy u/s.   Preterm labor symptoms and general obstetric precautions including but not limited to vaginal bleeding, contractions, leaking of fluid and fetal movement were reviewed in detail with the patient. Please refer to After Visit Summary for other counseling recommendations.  Return for rob, low risk.   [place] BingPickens, Sakia Schrimpf, MD

## 2017-01-15 NOTE — Telephone Encounter (Signed)
Patient in clinic today, never did return my call. I was able to verify her Medicaid coverage and rescheduled her appt for 01/15/17 @ 2pm. Called patient and left a message on her voice mail with appt details and asked her to call if she had any questions.

## 2017-01-16 ENCOUNTER — Encounter (HOSPITAL_COMMUNITY): Payer: Self-pay | Admitting: Obstetrics and Gynecology

## 2017-01-17 LAB — OBSTETRIC PANEL, INCLUDING HIV
Antibody Screen: NEGATIVE
BASOS ABS: 0 10*3/uL (ref 0.0–0.2)
Basos: 0 %
EOS (ABSOLUTE): 0.1 10*3/uL (ref 0.0–0.4)
Eos: 1 %
HIV Screen 4th Generation wRfx: NONREACTIVE
Hematocrit: 36.1 % (ref 34.0–46.6)
Hemoglobin: 11.6 g/dL (ref 11.1–15.9)
IMMATURE GRANS (ABS): 0 10*3/uL (ref 0.0–0.1)
IMMATURE GRANULOCYTES: 0 %
LYMPHS ABS: 2.7 10*3/uL (ref 0.7–3.1)
LYMPHS: 26 %
MCH: 27.8 pg (ref 26.6–33.0)
MCHC: 32.1 g/dL (ref 31.5–35.7)
MCV: 87 fL (ref 79–97)
MONOS ABS: 0.6 10*3/uL (ref 0.1–0.9)
Monocytes: 6 %
Neutrophils Absolute: 7.1 10*3/uL — ABNORMAL HIGH (ref 1.4–7.0)
Neutrophils: 67 %
PLATELETS: 197 10*3/uL (ref 150–379)
RBC: 4.17 x10E6/uL (ref 3.77–5.28)
RDW: 15.3 % (ref 12.3–15.4)
RPR: NONREACTIVE
Rh Factor: POSITIVE
Rubella Antibodies, IGG: 16.5 index (ref >0.99)
WBC: 10.4 10*3/uL (ref 3.4–10.8)

## 2017-01-17 LAB — HEPATITIS C ANTIBODY: Hep C Virus Ab: <0.1 s/co ratio (ref 0.0–0.9)

## 2017-01-17 LAB — HEPATITIS B SURFACE AG, CONFIRM: HBsAg Confirmation: POSITIVE — AB

## 2017-01-21 ENCOUNTER — Other Ambulatory Visit (HOSPITAL_COMMUNITY): Payer: Medicaid Other

## 2017-01-22 ENCOUNTER — Other Ambulatory Visit: Payer: Self-pay | Admitting: Obstetrics and Gynecology

## 2017-01-22 ENCOUNTER — Other Ambulatory Visit (HOSPITAL_COMMUNITY): Payer: Self-pay | Admitting: *Deleted

## 2017-01-22 ENCOUNTER — Ambulatory Visit (HOSPITAL_COMMUNITY)
Admission: RE | Admit: 2017-01-22 | Discharge: 2017-01-22 | Disposition: A | Discharge status: Home / Self Care | Payer: Medicaid Other | Source: Ambulatory Visit | Attending: Obstetrics and Gynecology | Admitting: Obstetrics and Gynecology

## 2017-01-22 ENCOUNTER — Encounter (HOSPITAL_COMMUNITY): Payer: Self-pay

## 2017-01-22 DIAGNOSIS — B1910 Unspecified viral hepatitis B without hepatic coma: Secondary | ICD-10-CM

## 2017-01-22 DIAGNOSIS — O98419 Viral hepatitis complicating pregnancy, unspecified trimester: Secondary | ICD-10-CM

## 2017-01-22 DIAGNOSIS — Z3689 Encounter for other specified antenatal screening: Secondary | ICD-10-CM | POA: Diagnosis not present

## 2017-01-22 DIAGNOSIS — O99282 Endocrine, nutritional and metabolic diseases complicating pregnancy, second trimester: Secondary | ICD-10-CM | POA: Diagnosis not present

## 2017-01-22 DIAGNOSIS — E079 Disorder of thyroid, unspecified: Secondary | ICD-10-CM | POA: Diagnosis not present

## 2017-01-22 DIAGNOSIS — O09522 Supervision of elderly multigravida, second trimester: Secondary | ICD-10-CM

## 2017-01-22 DIAGNOSIS — B181 Chronic viral hepatitis B without delta-agent: Secondary | ICD-10-CM | POA: Insufficient documentation

## 2017-01-22 DIAGNOSIS — O98412 Viral hepatitis complicating pregnancy, second trimester: Secondary | ICD-10-CM | POA: Diagnosis not present

## 2017-01-22 DIAGNOSIS — E0590 Thyrotoxicosis, unspecified without thyrotoxic crisis or storm: Secondary | ICD-10-CM

## 2017-01-22 DIAGNOSIS — Z3A22 22 weeks gestation of pregnancy: Secondary | ICD-10-CM | POA: Diagnosis present

## 2017-01-22 DIAGNOSIS — O0990 Supervision of high risk pregnancy, unspecified, unspecified trimester: Secondary | ICD-10-CM

## 2017-01-22 DIAGNOSIS — O09892 Supervision of other high risk pregnancies, second trimester: Secondary | ICD-10-CM | POA: Diagnosis present

## 2017-02-12 ENCOUNTER — Encounter: Payer: Medicaid Other | Admitting: Nurse Practitioner

## 2017-02-19 ENCOUNTER — Ambulatory Visit (HOSPITAL_COMMUNITY)
Admission: RE | Admit: 2017-02-19 | Discharge: 2017-02-19 | Disposition: A | Discharge status: Home / Self Care | Payer: Medicaid Other | Source: Ambulatory Visit | Attending: Obstetrics and Gynecology | Admitting: Obstetrics and Gynecology

## 2017-02-19 ENCOUNTER — Other Ambulatory Visit (HOSPITAL_COMMUNITY): Payer: Self-pay | Admitting: Obstetrics and Gynecology

## 2017-02-19 ENCOUNTER — Encounter (HOSPITAL_COMMUNITY): Payer: Self-pay

## 2017-02-19 ENCOUNTER — Ambulatory Visit (INDEPENDENT_AMBULATORY_CARE_PROVIDER_SITE_OTHER): Payer: Medicaid Other | Admitting: Family Medicine

## 2017-02-19 VITALS — BP 103/58 | HR 87 | Wt 159.0 lb

## 2017-02-19 DIAGNOSIS — O09522 Supervision of elderly multigravida, second trimester: Secondary | ICD-10-CM | POA: Diagnosis not present

## 2017-02-19 DIAGNOSIS — Z3A26 26 weeks gestation of pregnancy: Secondary | ICD-10-CM

## 2017-02-19 DIAGNOSIS — O99282 Endocrine, nutritional and metabolic diseases complicating pregnancy, second trimester: Secondary | ICD-10-CM | POA: Diagnosis not present

## 2017-02-19 DIAGNOSIS — E079 Disorder of thyroid, unspecified: Secondary | ICD-10-CM | POA: Insufficient documentation

## 2017-02-19 DIAGNOSIS — O98419 Viral hepatitis complicating pregnancy, unspecified trimester: Secondary | ICD-10-CM | POA: Diagnosis present

## 2017-02-19 DIAGNOSIS — E0590 Thyrotoxicosis, unspecified without thyrotoxic crisis or storm: Secondary | ICD-10-CM

## 2017-02-19 DIAGNOSIS — B1910 Unspecified viral hepatitis B without hepatic coma: Secondary | ICD-10-CM | POA: Diagnosis not present

## 2017-02-19 DIAGNOSIS — O0990 Supervision of high risk pregnancy, unspecified, unspecified trimester: Secondary | ICD-10-CM

## 2017-02-19 DIAGNOSIS — B181 Chronic viral hepatitis B without delta-agent: Secondary | ICD-10-CM

## 2017-02-19 DIAGNOSIS — O98412 Viral hepatitis complicating pregnancy, second trimester: Secondary | ICD-10-CM | POA: Diagnosis not present

## 2017-02-19 DIAGNOSIS — O99280 Endocrine, nutritional and metabolic diseases complicating pregnancy, unspecified trimester: Secondary | ICD-10-CM

## 2017-02-19 DIAGNOSIS — O0992 Supervision of high risk pregnancy, unspecified, second trimester: Secondary | ICD-10-CM

## 2017-02-19 NOTE — Progress Notes (Signed)
   PRENATAL VISIT NOTE  Subjective:  Michelle Logan is a 37 y.o. G2P1001 at 2498w2d being seen today for ongoing prenatal care.  She is currently monitored for the following issues for this high-risk pregnancy and has Chronic hepatitis B without delta agent without hepatic coma (HCC); Hyperthyroidism affecting pregnancy, antepartum; Supervision of high risk pregnancy, antepartum; and AMA (advanced maternal age) multigravida 35+, second trimester on their problem list.  Patient reports no complaints.  Contractions: Not present. Vag. Bleeding: None.  Movement: Present. Denies leaking of fluid.   The following portions of the patient's history were reviewed and updated as appropriate: allergies, current medications, past family history, past medical history, past social history, past surgical history and problem list. Problem list updated.  Objective:   Vitals:   02/19/17 1524  BP: (!) 103/58  Pulse: 87  Weight: 159 lb (72.1 kg)    Fetal Status: Fetal Heart Rate (bpm): 152   Movement: Present     General:  Alert, oriented and cooperative. Patient is in no acute distress.  Skin: Skin is warm and dry. No rash noted.   Cardiovascular: Normal heart rate noted  Respiratory: Normal respiratory effort, no problems with respiration noted  Abdomen: Soft, gravid, appropriate for gestational age.  Pain/Pressure: Present     Pelvic: Cervical exam deferred        Extremities: Normal range of motion.  Edema: None  Mental Status:  Normal mood and affect. Normal behavior. Normal judgment and thought content.   Assessment and Plan:  Pregnancy: G2P1001 at 2598w2d  1. Supervision of high risk pregnancy, antepartum - Routine care - F/u in 2 weeks for 28-wk labs including 2-hr GTT  2. AMA (advanced maternal age) multigravida 35+, second trimester Had low risk NIPS Repeat U/S today with normal intercal fetal anatomy and growth. Repeat recommended in 6 weeks, on 04/02/17  3. Hyperthyroidism affecting  pregnancy, antepartum TFTs (TSH, free T4/total T4) should be checked q 4 weeks per UpToDate - Check TFTs today - Per MFM, given AMA and hyperthyroidism, interval growth anatomy recommended in 6 weeks; scheduled for 04/02/17  4. Chronic hepatitis B without delta agent without hepatic coma (HCC) Following with ID, last seen on 12/11/16 - Follow up with ID onn 03/19/17  Preterm labor symptoms and general obstetric precautions including but not limited to vaginal bleeding, contractions, leaking of fluid and fetal movement were reviewed in detail with the patient. Please refer to After Visit Summary for other counseling recommendations.  Return in about 2 weeks (around 03/05/2017) for HROB and 28-wk labs/2-hr GTT.   Frederik PearJulie P Phillipe Clemon, MD  Future Appointments  Date Time Provider Department Center  03/13/2017  8:20 AM [facility]  03/13/2017  9:55 AM Adam PhenixArnold, James G, MD [facility]  03/19/2017  3:00 PM Daiva EvesVan Dam, Lisette Grinderornelius N, MD [facility]  04/02/2017  1:00 PM [facility] US 3 [facility]

## 2017-02-19 NOTE — Addendum Note (Signed)
Encounter addended by: Lenise ArenaBazemore, Ori Kreiter, RDMS on: 02/19/2017 3:08 PM  Actions taken: Imaging Exam ended

## 2017-02-20 ENCOUNTER — Encounter: Payer: Self-pay | Admitting: Family Medicine

## 2017-02-20 ENCOUNTER — Other Ambulatory Visit (HOSPITAL_COMMUNITY): Payer: Self-pay | Admitting: *Deleted

## 2017-02-20 DIAGNOSIS — O09523 Supervision of elderly multigravida, third trimester: Secondary | ICD-10-CM

## 2017-02-20 LAB — T4, FREE: FREE T4: 0.85 ng/dL (ref 0.82–1.77)

## 2017-02-20 LAB — T4: T4, Total: 8.2 ug/dL (ref 4.5–12.0)

## 2017-02-20 LAB — TSH: TSH: 0.612 u[IU]/mL (ref 0.450–4.500)

## 2017-03-04 ENCOUNTER — Other Ambulatory Visit: Payer: Medicaid Other

## 2017-03-04 ENCOUNTER — Encounter: Payer: Medicaid Other | Admitting: Family Medicine

## 2017-03-13 ENCOUNTER — Other Ambulatory Visit: Payer: Medicaid Other

## 2017-03-13 ENCOUNTER — Other Ambulatory Visit: Payer: Self-pay | Admitting: General Practice

## 2017-03-13 ENCOUNTER — Ambulatory Visit (INDEPENDENT_AMBULATORY_CARE_PROVIDER_SITE_OTHER): Payer: Medicaid Other | Admitting: Obstetrics & Gynecology

## 2017-03-13 VITALS — BP 111/68 | HR 90 | Wt 166.0 lb

## 2017-03-13 DIAGNOSIS — O09522 Supervision of elderly multigravida, second trimester: Secondary | ICD-10-CM

## 2017-03-13 DIAGNOSIS — Z23 Encounter for immunization: Secondary | ICD-10-CM | POA: Diagnosis present

## 2017-03-13 DIAGNOSIS — O0990 Supervision of high risk pregnancy, unspecified, unspecified trimester: Secondary | ICD-10-CM

## 2017-03-13 DIAGNOSIS — O26899 Other specified pregnancy related conditions, unspecified trimester: Secondary | ICD-10-CM | POA: Insufficient documentation

## 2017-03-13 DIAGNOSIS — R12 Heartburn: Secondary | ICD-10-CM

## 2017-03-13 LAB — POCT URINALYSIS DIP (DEVICE)
Bilirubin Urine: NEGATIVE
Glucose, UA: 500 mg/dL — AB
Hgb urine dipstick: NEGATIVE
Ketones, ur: NEGATIVE mg/dL
NITRITE: NEGATIVE
PH: 7 (ref 5.0–8.0)
Protein, ur: NEGATIVE mg/dL
Specific Gravity, Urine: 1.015 (ref 1.005–1.030)
UROBILINOGEN UA: 0.2 mg/dL (ref 0.0–1.0)

## 2017-03-13 MED ORDER — PANTOPRAZOLE SODIUM 20 MG PO TBEC: 20.0000 mg | DELAYED_RELEASE_TABLET | Freq: Every day | ORAL | 2 refills | Status: DC

## 2017-03-13 NOTE — Patient Instructions (Signed)

## 2017-03-13 NOTE — Progress Notes (Signed)
   PRENATAL VISIT NOTE  Subjective:  Michelle Logan is a 37 y.o. G2P1001 at 7338w3d being seen today for ongoing prenatal care.  She is currently monitored for the following issues for this high-risk pregnancy and has Chronic hepatitis B without delta agent without hepatic coma (HCC); Hyperthyroidism affecting pregnancy, antepartum; Supervision of high risk pregnancy, antepartum; AMA (advanced maternal age) multigravida 35+, second trimester; and Heartburn during pregnancy, antepartum on their problem list.  Patient reports heartburn and nausea.  Contractions: Not present. Vag. Bleeding: None.  Movement: Present. Denies leaking of fluid.   The following portions of the patient's history were reviewed and updated as appropriate: allergies, current medications, past family history, past medical history, past social history, past surgical history and problem list. Problem list updated.  Objective:   Vitals:   03/13/17 0949  BP: 111/68  Pulse: 90  Weight: 166 lb (75.3 kg)    Fetal Status: Fetal Heart Rate (bpm): 140   Movement: Present     General:  Alert, oriented and cooperative. Patient is in no acute distress.  Skin: Skin is warm and dry. No rash noted.   Cardiovascular: Normal heart rate noted  Respiratory: Normal respiratory effort, no problems with respiration noted  Abdomen: Soft, gravid, appropriate for gestational age.  Pain/Pressure: Absent     Pelvic: Cervical exam deferred        Extremities: Normal range of motion.  Edema: None  Mental Status:  Normal mood and affect. Normal behavior. Normal judgment and thought content.   Assessment and Plan:  Pregnancy: G2P1001 at 4638w3d  1. Supervision of high risk pregnancy, antepartum  - Tdap vaccine greater than or equal to 7yo IM  2. Heartburn during pregnancy, antepartum  - pantoprazole (PROTONIX) 20 MG tablet; Take 1 tablet (20 mg total) by mouth daily.  Dispense: 30 tablet; Refill: 2  Preterm labor symptoms and general  obstetric precautions including but not limited to vaginal bleeding, contractions, leaking of fluid and fetal movement were reviewed in detail with the patient. Please refer to After Visit Summary for other counseling recommendations.  Return in about 2 weeks (around 03/27/2017).   Scheryl DarterJames Sherre Wooton, MD

## 2017-03-18 LAB — CBC
HEMATOCRIT: 33.1 % — AB (ref 34.0–46.6)
Hemoglobin: 11 g/dL — ABNORMAL LOW (ref 11.1–15.9)
MCH: 27.1 pg (ref 26.6–33.0)
MCHC: 33.2 g/dL (ref 31.5–35.7)
MCV: 82 fL (ref 79–97)
PLATELETS: 190 10*3/uL (ref 150–379)
RBC: 4.06 x10E6/uL (ref 3.77–5.28)
RDW: 15.1 % (ref 12.3–15.4)
WBC: 9.7 10*3/uL (ref 3.4–10.8)

## 2017-03-18 LAB — HIV ANTIBODY (ROUTINE TESTING W REFLEX): HIV Screen 4th Generation wRfx: NONREACTIVE

## 2017-03-18 LAB — GLUCOSE TOLERANCE, 2 HOURS W/ 1HR
GLUCOSE, 2 HOUR: 118 mg/dL (ref 65–152)
Glucose, 1 hour: 141 mg/dL (ref 65–179)
Glucose, Fasting: 112 mg/dL — ABNORMAL HIGH (ref 65–91)

## 2017-03-18 LAB — RPR: RPR Ser Ql: NONREACTIVE

## 2017-03-19 ENCOUNTER — Ambulatory Visit (INDEPENDENT_AMBULATORY_CARE_PROVIDER_SITE_OTHER): Payer: Medicaid Other | Admitting: Infectious Disease

## 2017-03-19 ENCOUNTER — Encounter: Payer: Self-pay | Admitting: Infectious Disease

## 2017-03-19 VITALS — BP 101/61 | HR 86 | Temp 98.1°F | Ht 64.0 in | Wt 168.0 lb

## 2017-03-19 DIAGNOSIS — O98913 Unspecified maternal infectious and parasitic disease complicating pregnancy, third trimester: Secondary | ICD-10-CM

## 2017-03-19 DIAGNOSIS — B181 Chronic viral hepatitis B without delta-agent: Secondary | ICD-10-CM | POA: Diagnosis not present

## 2017-03-19 NOTE — Progress Notes (Signed)
Chief complaint: follow-up for chronic hepatitis B without delta agent in pregnant lady  Subjective:    Patient ID: Michelle Logan, female    DOB: 1980-10-26, 37 y.o.   MRN: [id number]  HPI  37 year old [place] lady who was diagnosed with Hepatitis B with pregnancy labs. She tested negative for HIV. Her Hep B DNA was only a few hundred copies. Her LFT's were normal. She was hep E ag antibody POSITIVE. She had baby without complications and we did not initiate treatment for HBV.   She is now pregnant again and had labs done with OB which show HIV -, HBV DNA in low counts, normal LFTs.  She returns for followup with her husband who is also from [place] originally.  Her HIV test in March has also been negative.     Past Medical History:  Diagnosis Date  . Chronic hepatitis B without delta agent without hepatic coma (HCC) 12/21/2014  . Medical history non-contributory   . Pregnancy and infectious disease 12/21/2014  . Thyroid disease during pregnancy 12/21/2014  . UTI (urinary tract infection) 12/21/2014    Past Surgical History:  Procedure Laterality Date  . NO PAST SURGERIES      No family history on file.    Social History   Socioeconomic History  . Marital status: Married    Spouse name: None  . Number of children: None  . Years of education: None  . Highest education level: None  Social Needs  . Financial resource strain: None  . Food insecurity - worry: None  . Food insecurity - inability: None  . Transportation needs - medical: None  . Transportation needs - non-medical: None  Occupational History  . None  Tobacco Use  . Smoking status: Never Smoker  . Smokeless tobacco: Never Used  Substance and Sexual Activity  . Alcohol use: No    Alcohol/week: 0.0 oz  . Drug use: No  . Sexual activity: Yes    Partners: Male    Birth control/protection: None  Other Topics Concern  . None  Social History Narrative  . None    No Known Allergies   Current  Outpatient Medications:  .  pantoprazole (PROTONIX) 20 MG tablet, Take 1 tablet (20 mg total) by mouth daily., Disp: 30 tablet, Rfl: 2 .  Prenat-Fe Poly-Methfol-FA-DHA (VITAFOL ULTRA) 29-0.6-0.4-200 MG CAPS, Take 1 tablet daily by mouth., Disp: 30 capsule, Rfl: 12 .  ferrous sulfate 325 (65 FE) MG tablet, Take 1 tablet (325 mg total) by mouth 2 (two) times daily with a meal. For 14 days, then once daily for 28 days. (Patient not taking: Reported on 12/18/2016), Disp: 45 tablet, Rfl: 1 .  promethazine (PHENERGAN) 25 MG tablet, Take 1 tablet (25 mg total) by mouth every 6 (six) hours as needed for nausea or vomiting., Disp: 30 tablet, Rfl: 2    Review of Systems  Constitutional: Negative for activity change, appetite change, chills, diaphoresis, fatigue, fever and unexpected weight change.  HENT: Negative for congestion, rhinorrhea, sinus pressure, sneezing, sore throat and trouble swallowing.   Eyes: Negative for photophobia and visual disturbance.  Respiratory: Negative for cough, chest tightness, shortness of breath, wheezing and stridor.   Cardiovascular: Negative for chest pain, palpitations and leg swelling.  Gastrointestinal: Negative for abdominal distention, abdominal pain, anal bleeding, blood in stool, constipation, diarrhea, nausea and vomiting.  Genitourinary: Negative for difficulty urinating, dysuria, flank pain and hematuria.  Musculoskeletal: Negative for arthralgias, back pain, gait problem, joint swelling and myalgias.  Skin: Negative for color change, pallor, rash and wound.  Neurological: Negative for dizziness, tremors, weakness and light-headedness.  Hematological: Negative for adenopathy. Does not bruise/bleed easily.  Psychiatric/Behavioral: Negative for agitation, behavioral problems, confusion, decreased concentration, dysphoric mood and sleep disturbance.       Objective:   Physical Exam  Constitutional: She is oriented to person, place, and time. She appears  well-developed and well-nourished. No distress.  HENT:  Head: Normocephalic and atraumatic.  Mouth/Throat: No oropharyngeal exudate.  Eyes: Conjunctivae and EOM are normal. No scleral icterus.  Neck: Normal range of motion. Neck supple.  Cardiovascular: Normal rate and regular rhythm.  Pulmonary/Chest: Effort normal. No respiratory distress. She has no wheezes.  Abdominal: Soft. Bowel sounds are normal. There is no tenderness.  Musculoskeletal: She exhibits no edema or tenderness.  Neurological: She is alert and oriented to person, place, and time. She exhibits normal muscle tone. Coordination normal.  Skin: Skin is warm and dry. No rash noted. She is not diaphoretic. No erythema. No pallor.  Psychiatric: She has a normal mood and affect. Her behavior is normal. Judgment and thought content normal.          Assessment & Plan:   Chronic hepatitis B without delta agent without coma or cirrhosis:   I will recheck her HBV DNA , LFT's and see her again as the end of April (she is due in May)  Recommended to husband that he be checked more so that he may himself have been infected perinatally rather than sexually from the patient.     Acey Lav, MD

## 2017-03-20 LAB — COMPLETE METABOLIC PANEL WITH GFR
AG Ratio: 1.3 (calc) (ref 1.0–2.5)
ALT: 10 U/L (ref 6–29)
AST: 13 U/L (ref 10–30)
Albumin: 3.2 g/dL — ABNORMAL LOW (ref 3.6–5.1)
Alkaline phosphatase (APISO): 141 U/L — ABNORMAL HIGH (ref 33–115)
BUN: 8 mg/dL (ref 7–25)
CALCIUM: 8.7 mg/dL (ref 8.6–10.2)
CO2: 24 mmol/L (ref 20–32)
Chloride: 105 mmol/L (ref 98–110)
Creat: 0.64 mg/dL (ref 0.50–1.10)
GFR, EST AFRICAN AMERICAN: 135 mL/min/{1.73_m2} (ref >=60)
GFR, EST NON AFRICAN AMERICAN: 116 mL/min/{1.73_m2} (ref >=60)
GLUCOSE: 100 mg/dL — AB (ref 65–99)
Globulin: 2.5 g/dL (calc) (ref 1.9–3.7)
Potassium: 3.8 mmol/L (ref 3.5–5.3)
Sodium: 137 mmol/L (ref 135–146)
TOTAL PROTEIN: 5.7 g/dL — AB (ref 6.1–8.1)
Total Bilirubin: 0.2 mg/dL (ref 0.2–1.2)

## 2017-03-20 LAB — HEPATITIS A ANTIBODY, TOTAL: Hepatitis A AB,Total: REACTIVE — AB

## 2017-03-22 LAB — HEPATITIS B DNA, ULTRAQUANTITATIVE, PCR
HEPATITIS B DNA: 453 [IU]/mL — AB
Hepatitis B DNA (Calc): 2.66 Log IU/mL — ABNORMAL HIGH

## 2017-03-26 ENCOUNTER — Ambulatory Visit (INDEPENDENT_AMBULATORY_CARE_PROVIDER_SITE_OTHER): Payer: Medicaid Other | Admitting: Family Medicine

## 2017-03-26 VITALS — BP 102/70 | HR 95

## 2017-03-26 DIAGNOSIS — O24419 Gestational diabetes mellitus in pregnancy, unspecified control: Secondary | ICD-10-CM | POA: Insufficient documentation

## 2017-03-26 DIAGNOSIS — B181 Chronic viral hepatitis B without delta-agent: Secondary | ICD-10-CM

## 2017-03-26 DIAGNOSIS — O24410 Gestational diabetes mellitus in pregnancy, diet controlled: Secondary | ICD-10-CM

## 2017-03-26 DIAGNOSIS — O0990 Supervision of high risk pregnancy, unspecified, unspecified trimester: Secondary | ICD-10-CM

## 2017-03-26 MED ORDER — ACCU-CHEK AVIVA PLUS W/DEVICE KIT: 1.0000 | PACK | Freq: Four times a day (QID) | 0 refills | Status: DC

## 2017-03-26 MED ORDER — ACCU-CHEK FASTCLIX LANCETS MISC: 1.0000 | Freq: Four times a day (QID) | 12 refills | Status: DC

## 2017-03-26 MED ORDER — GLUCOSE BLOOD VI STRP: ORAL_STRIP | 12 refills | Status: DC

## 2017-03-26 NOTE — Patient Instructions (Signed)

## 2017-03-26 NOTE — Progress Notes (Signed)
   PRENATAL VISIT NOTE  Subjective:  Michelle Logan is a 37 y.o. G2P1001 at 5175w2d being seen today for ongoing prenatal care.  She is currently monitored for the following issues for this high-risk pregnancy and has Chronic hepatitis B without delta agent without hepatic coma (HCC); Hyperthyroidism affecting pregnancy, antepartum; Supervision of high risk pregnancy, antepartum; AMA (advanced maternal age) multigravida 35+, second trimester; Heartburn during pregnancy, antepartum; and Gestational diabetes mellitus (GDM) on their problem list.  Patient reports no complaints.  Contractions: Not present. Vag. Bleeding: None.  Movement: Present. Denies leaking of fluid.   The following portions of the patient's history were reviewed and updated as appropriate: allergies, current medications, past family history, past medical history, past social history, past surgical history and problem list. Problem list updated.  Objective:   Vitals:   03/26/17 1546  BP: 102/70  Pulse: 95    Fetal Status: Fetal Heart Rate (bpm): 153 Fundal Height: 32 cm Movement: Present     General:  Alert, oriented and cooperative. Patient is in no acute distress.  Skin: Skin is warm and dry. No rash noted.   Cardiovascular: Normal heart rate noted  Respiratory: Normal respiratory effort, no problems with respiration noted  Abdomen: Soft, gravid, appropriate for gestational age.  Pain/Pressure: Absent     Pelvic: Cervical exam deferred        Extremities: Normal range of motion.  Edema: None  Mental Status:  Normal mood and affect. Normal behavior. Normal judgment and thought content.   Assessment and Plan:  Pregnancy: G2P1001 at 7375w2d  1. Diet controlled gestational diabetes mellitus (GDM), antepartum Has not been checking blood sugar because was unaware of diagnosis. Will send supplies and schedule with diabetes education  2. Supervision of high risk pregnancy, antepartum   3. Chronic hepatitis B without  delta agent without hepatic coma (HCC) Followed by ID. To be seen again in April  Preterm labor symptoms and general obstetric precautions including but not limited to vaginal bleeding, contractions, leaking of fluid and fetal movement were reviewed in detail with the patient. Please refer to After Visit Summary for other counseling recommendations.  Return in about 2 weeks (around 04/09/2017).   Rolm BookbinderAmber Isabella Roemmich, DO

## 2017-04-02 ENCOUNTER — Other Ambulatory Visit (HOSPITAL_COMMUNITY): Payer: Self-pay | Admitting: Obstetrics and Gynecology

## 2017-04-02 ENCOUNTER — Ambulatory Visit (HOSPITAL_COMMUNITY)
Admission: RE | Admit: 2017-04-02 | Discharge: 2017-04-02 | Disposition: A | Discharge status: Home / Self Care | Payer: Medicaid Other | Source: Ambulatory Visit | Attending: Obstetrics and Gynecology | Admitting: Obstetrics and Gynecology

## 2017-04-02 ENCOUNTER — Encounter (HOSPITAL_COMMUNITY): Payer: Self-pay

## 2017-04-02 DIAGNOSIS — O09523 Supervision of elderly multigravida, third trimester: Secondary | ICD-10-CM

## 2017-04-02 DIAGNOSIS — O09512 Supervision of elderly primigravida, second trimester: Secondary | ICD-10-CM | POA: Diagnosis not present

## 2017-04-02 DIAGNOSIS — O24410 Gestational diabetes mellitus in pregnancy, diet controlled: Secondary | ICD-10-CM

## 2017-04-02 DIAGNOSIS — Z3A32 32 weeks gestation of pregnancy: Secondary | ICD-10-CM | POA: Diagnosis not present

## 2017-04-02 DIAGNOSIS — Z362 Encounter for other antenatal screening follow-up: Secondary | ICD-10-CM

## 2017-04-03 ENCOUNTER — Other Ambulatory Visit (HOSPITAL_COMMUNITY): Payer: Self-pay | Admitting: *Deleted

## 2017-04-03 DIAGNOSIS — O24410 Gestational diabetes mellitus in pregnancy, diet controlled: Secondary | ICD-10-CM

## 2017-04-04 ENCOUNTER — Other Ambulatory Visit: Payer: Self-pay | Admitting: Obstetrics & Gynecology

## 2017-04-04 DIAGNOSIS — O26899 Other specified pregnancy related conditions, unspecified trimester: Secondary | ICD-10-CM

## 2017-04-04 DIAGNOSIS — R12 Heartburn: Principal | ICD-10-CM

## 2017-04-08 ENCOUNTER — Ambulatory Visit: Payer: Medicaid Other | Admitting: *Deleted

## 2017-04-08 ENCOUNTER — Encounter: Payer: Medicaid Other | Attending: Medical | Admitting: *Deleted

## 2017-04-08 DIAGNOSIS — Z713 Dietary counseling and surveillance: Secondary | ICD-10-CM | POA: Insufficient documentation

## 2017-04-08 DIAGNOSIS — R7302 Impaired glucose tolerance (oral): Secondary | ICD-10-CM | POA: Diagnosis not present

## 2017-04-08 NOTE — Progress Notes (Signed)
  Patient was seen on 04/08/2017 for Gestational Diabetes self-management. EDD 05/26/2017. Patient states no history of GDM. Diet history obtained. Patient eats good variety of all food groups and beverages include hot tea with small spoon of brown sugar and water with occasional apple juice.  She staa=tes she had some tea with sugar in it the AM of the Glucose Tolerance Test, but some of her post meal BG's are elevated so still a good idea to follow for now. 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 The following learning objectives were met by the patient :   States the definition of Gestational Diabetes  States why dietary management is important in controlling blood glucose  Describes the effects of carbohydrates on blood glucose levels  Demonstrates ability to create a balanced meal plan  Demonstrates carbohydrate counting   States when to check blood glucose levels  Demonstrates proper blood glucose monitoring techniques  States the effect of stress and exercise on blood glucose levels  States the importance of limiting caffeine and abstaining from alcohol and smoking  Plan:  Aim for 3 Carb Choices per meal (45 grams) +/- 1 either way  Aim for 1-2 Carbs per snack Begin reading food labels for Total Carbohydrate of foods Consider  increasing your activity level by walking or other activity daily as tolerated Begin checking BG before breakfast and 2 hours after first bite of breakfast, lunch and dinner as directed by MD  Bring Log Book/Sheet to every medical appointment   Patient was introduced to [employer] and plans to use as record of BG electronically once she can be added by [employer] Support to Glucose Management section Take medication if directed by MD  Patient already has a meter: Accu Chek Guide And is testing pre breakfast and 2 hours after supper so far Review of Log Book shows: FBG all within target range, and most of after supper are also within range pre diet  instruction  Patient instructed to monitor glucose levels: FBS: 60 - 95 mg/dl 2 hour: <120 mg/dl  Patient received the following handouts:  Nutrition Diabetes and Pregnancy  Carbohydrate Counting List  Patient will be seen for follow-up as needed.

## 2017-04-09 ENCOUNTER — Ambulatory Visit (INDEPENDENT_AMBULATORY_CARE_PROVIDER_SITE_OTHER): Payer: Medicaid Other | Admitting: Obstetrics and Gynecology

## 2017-04-09 VITALS — BP 105/63 | HR 95 | Wt 169.0 lb

## 2017-04-09 DIAGNOSIS — O09522 Supervision of elderly multigravida, second trimester: Secondary | ICD-10-CM

## 2017-04-09 DIAGNOSIS — O99280 Endocrine, nutritional and metabolic diseases complicating pregnancy, unspecified trimester: Secondary | ICD-10-CM

## 2017-04-09 DIAGNOSIS — E0590 Thyrotoxicosis, unspecified without thyrotoxic crisis or storm: Secondary | ICD-10-CM

## 2017-04-09 DIAGNOSIS — B181 Chronic viral hepatitis B without delta-agent: Secondary | ICD-10-CM

## 2017-04-09 DIAGNOSIS — O24410 Gestational diabetes mellitus in pregnancy, diet controlled: Secondary | ICD-10-CM

## 2017-04-09 DIAGNOSIS — O0990 Supervision of high risk pregnancy, unspecified, unspecified trimester: Secondary | ICD-10-CM

## 2017-04-09 NOTE — Patient Instructions (Signed)

## 2017-04-09 NOTE — Progress Notes (Signed)
   PRENATAL VISIT NOTE  Subjective:  Michelle Logan is a 37 y.o. G2P1001 at 69w2dbeing seen today for ongoing prenatal care.  She is currently monitored for the following issues for this high-risk pregnancy and has Chronic hepatitis B without delta agent without hepatic coma ([place]; Hyperthyroidism affecting pregnancy, antepartum; Supervision of high risk pregnancy, antepartum; AMA (advanced maternal age) multigravida 35+, second trimester; Heartburn during pregnancy, antepartum; and Gestational diabetes mellitus (GDM) on their problem list.  Patient reports no complaints.  Contractions: Not present. Vag. Bleeding: None.  Movement: Present. Denies leaking of fluid.   The following portions of the patient's history were reviewed and updated as appropriate: allergies, current medications, past family history, past medical history, past social history, past surgical history and problem list. Problem list updated.  Objective:   Vitals:   04/09/17 1128  BP: 105/63  Pulse: 95  Weight: 169 lb (76.7 kg)    Fetal Status: Fetal Heart Rate (bpm): 140 Fundal Height: 33 cm Movement: Present     General:  Alert, oriented and cooperative. Patient is in no acute distress.  Skin: Skin is warm and dry. No rash noted.   Cardiovascular: Normal heart rate noted  Respiratory: Normal respiratory effort, no problems with respiration noted  Abdomen: Soft, gravid, appropriate for gestational age.  Pain/Pressure: Absent     Pelvic: Cervical exam deferred        Extremities: Normal range of motion.  Edema: None  Mental Status: Normal mood and affect. Normal behavior. Normal judgment and thought content.   Assessment and Plan:  Pregnancy: G2P1001 at 374w2d1. Supervision of high risk pregnancy, antepartum Patient is doing well without complaints  2. AMA (advanced maternal age) multigravida 3538+second trimester   3. Chronic hepatitis B without delta agent without hepatic coma (HCC) Followed by ID  4.  Diet controlled gestational diabetes mellitus (GDM) in third trimester Patient met with educator yesterday. She has been checking fasting and 1 hour after dinner  CBGs reviewed and all within range Continue diet control  5. Hyperthyroidism affecting pregnancy, antepartum No meds Follow up growth on 4/24  Preterm labor symptoms and general obstetric precautions including but not limited to vaginal bleeding, contractions, leaking of fluid and fetal movement were reviewed in detail with the patient. Please refer to After Visit Summary for other counseling recommendations.  Return in about 2 weeks (around 04/23/2017) for ROB.  Future Appointments  Date Time Provider [place]4/17/2019  8:55 AM SmTamala JulianiWilliamsCNNorth [place]OA[facility]4/24/2019  8:15 AM DaSloan LeiterMD [facility] Clinic[facility]oungs[place]4/24/2019  9:45 AM WH[place] WH-MFCUS MFC-US  04/30/2017  2:30 PM VaTommy MedalCoLavell IslamMD RCID-RCID RCID    PeMora BellmanMD

## 2017-04-23 ENCOUNTER — Ambulatory Visit (INDEPENDENT_AMBULATORY_CARE_PROVIDER_SITE_OTHER): Payer: Medicaid Other | Admitting: Advanced Practice Midwife

## 2017-04-23 VITALS — BP 107/59 | HR 104 | Wt 172.9 lb

## 2017-04-23 DIAGNOSIS — O219 Vomiting of pregnancy, unspecified: Secondary | ICD-10-CM

## 2017-04-23 DIAGNOSIS — O24410 Gestational diabetes mellitus in pregnancy, diet controlled: Secondary | ICD-10-CM

## 2017-04-23 DIAGNOSIS — O99280 Endocrine, nutritional and metabolic diseases complicating pregnancy, unspecified trimester: Secondary | ICD-10-CM

## 2017-04-23 DIAGNOSIS — O09523 Supervision of elderly multigravida, third trimester: Secondary | ICD-10-CM

## 2017-04-23 DIAGNOSIS — O0993 Supervision of high risk pregnancy, unspecified, third trimester: Secondary | ICD-10-CM

## 2017-04-23 DIAGNOSIS — O09522 Supervision of elderly multigravida, second trimester: Secondary | ICD-10-CM

## 2017-04-23 DIAGNOSIS — O99283 Endocrine, nutritional and metabolic diseases complicating pregnancy, third trimester: Secondary | ICD-10-CM

## 2017-04-23 DIAGNOSIS — B181 Chronic viral hepatitis B without delta-agent: Secondary | ICD-10-CM

## 2017-04-23 DIAGNOSIS — O0990 Supervision of high risk pregnancy, unspecified, unspecified trimester: Secondary | ICD-10-CM

## 2017-04-23 DIAGNOSIS — E0590 Thyrotoxicosis, unspecified without thyrotoxic crisis or storm: Secondary | ICD-10-CM

## 2017-04-23 LAB — POCT URINALYSIS DIP (DEVICE)
GLUCOSE, UA: NEGATIVE mg/dL
Nitrite: NEGATIVE
PH: 7 (ref 5.0–8.0)
PROTEIN: 30 mg/dL — AB
SPECIFIC GRAVITY, URINE: 1.02 (ref 1.005–1.030)
UROBILINOGEN UA: 0.2 mg/dL (ref 0.0–1.0)

## 2017-04-23 MED ORDER — PROMETHAZINE HCL 25 MG PO TABS: 25.0000 mg | ORAL_TABLET | Freq: Four times a day (QID) | ORAL | 2 refills | Status: DC | PRN

## 2017-04-23 NOTE — Progress Notes (Signed)
   PRENATAL VISIT NOTE  Subjective:  Michelle Logan is a 37 y.o. G2P1001 at 2229w2d being seen today for ongoing prenatal care.  She is currently monitored for the following issues for this high-risk pregnancy and has Chronic hepatitis B without delta agent without hepatic coma (HCC); Hyperthyroidism affecting pregnancy, antepartum; Supervision of high risk pregnancy, antepartum; AMA (advanced maternal age) multigravida 35+, second trimester; Heartburn during pregnancy, antepartum; and Gestational diabetes mellitus (GDM) on their problem list.  Patient reports nausea and vomiting.  Contractions: Not present. Vag. Bleeding: None.  Movement: Present. Denies leaking of fluid.   The following portions of the patient's history were reviewed and updated as appropriate: allergies, current medications, past family history, past medical history, past social history, past surgical history and problem list. Problem list updated.  Objective:   Vitals:   04/23/17 0910  BP: (!) 107/59  Pulse: (!) 104  Weight: 172 lb 14.4 oz (78.4 kg)    Fetal Status: Fetal Heart Rate (bpm): 140   Movement: Present     General:  Alert, oriented and cooperative. Patient is in no acute distress.  Skin: Skin is warm and dry. No rash noted.   Cardiovascular: Normal heart rate noted  Respiratory: Normal respiratory effort, no problems with respiration noted  Abdomen: Soft, gravid, appropriate for gestational age.  Pain/Pressure: Absent     Pelvic: Cervical exam deferred        Extremities: Normal range of motion.  Edema: Trace  Mental Status: Normal mood and affect. Normal behavior. Normal judgment and thought content.   Fasting CBGs: 71-95 (1 out of range) PCB:  81-140 1 out of range) PCL:  109-141 (1 out of range) PCD: 95-132 (1 out of range)   Assessment and Plan:  Pregnancy: G2P1001 at 3129w2d  1. Diet controlled gestational diabetes mellitus (GDM) in third trimester - Continue diet - Growth US 4/24  2.  Supervision of high risk pregnancy, antepartum   3. AMA (advanced maternal age) multigravida 35+, second trimester   4. Chronic hepatitis B without delta agent without hepatic coma (HCC) - ID 4/24  5. Hyperthyroidism affecting pregnancy, antepartum-Labs stable off meds    Term labor symptoms and general obstetric precautions including but not limited to vaginal bleeding, contractions, leaking of fluid and fetal movement were reviewed in detail with the patient. Please refer to After Visit Summary for other counseling recommendations.  Return in about 1 week (around 04/30/2017) for ROB/GBS.  Future Appointments  Date Time Provider Department Center  04/30/2017  8:15 AM Conan Bowensavis, Kelly M, MD [facility]  04/30/2017  9:45 AM WH-MFC US 2 WH-MFCUS MFC-US  04/30/2017  2:30 PM Daiva EvesVan Dam, Lisette Grinderornelius N, MD [facility]-RCID RCID    Dorathy KinsmanVirginia Wolfe [place], [place] Island[place]M

## 2017-04-23 NOTE — Progress Notes (Incomplete)
   PRENATAL VISIT NOTE  Subjective:  Michelle Logan is a 37 y.o. G2P1001 at 7448w2d being seen today for ongoing prenatal care.  She is currently monitored for the following issues for this {Blank single:19197::"high-risk","low-risk"} pregnancy and has Chronic hepatitis B without delta agent without hepatic coma (HCC); Hyperthyroidism affecting pregnancy, antepartum; Supervision of high risk pregnancy, antepartum; AMA (advanced maternal age) multigravida 35+, second trimester; Heartburn during pregnancy, antepartum; and Gestational diabetes mellitus (GDM) on their problem list.  Patient reports {sx:[id number]}.  Contractions: Not present. Vag. Bleeding: None.  Movement: Present. Denies leaking of fluid.   The following portions of the patient's history were reviewed and updated as appropriate: allergies, current medications, past family history, past medical history, past social history, past surgical history and problem list. Problem list updated.  Objective:   Vitals:   04/23/17 0910  BP: (!) 107/59  Pulse: (!) 104  Weight: 172 lb 14.4 oz (78.4 kg)    Fetal Status: Fetal Heart Rate (bpm): 140   Movement: Present     General:  Alert, oriented and cooperative. Patient is in no acute distress.  Skin: Skin is warm and dry. No rash noted.   Cardiovascular: Normal heart rate noted  Respiratory: Normal respiratory effort, no problems with respiration noted  Abdomen: Soft, gravid, appropriate for gestational age.  Pain/Pressure: Absent     Pelvic: {Blank single:19197::"Cervical exam performed","Cervical exam deferred"}        Extremities: Normal range of motion.  Edema: Trace  Mental Status: Normal mood and affect. Normal behavior. Normal judgment and thought content.   Assessment and Plan:  Pregnancy: G2P1001 at 7748w2d  1. Diet controlled gestational diabetes mellitus (GDM) in third trimester ***  2. Supervision of high risk pregnancy, antepartum ***  3. AMA (advanced maternal age)  multigravida 35+, second trimester ***  4. Chronic hepatitis B without delta agent without hepatic coma (HCC) ***  5. Hyperthyroidism affecting pregnancy, antepartum ***  {Blank single:19197::"Term","Preterm"} labor symptoms and general obstetric precautions including but not limited to vaginal bleeding, contractions, leaking of fluid and fetal movement were reviewed in detail with the patient. Please refer to After Visit Summary for other counseling recommendations.  Return in about 1 week (around 04/30/2017) for ROB/GBS.  Future Appointments  Date Time Provider Department Center  04/30/2017  8:15 AM Conan Bowensavis, Kelly M, MD [facility]  04/30/2017  9:45 AM WH-MFC US 2 WH-MFCUS MFC-US  04/30/2017  2:30 PM Daiva EvesVan Dam, Lisette Grinderornelius N, MD [facility]ID RCID    Dorathy KinsmanVirginia Krystopher Kuenzel, [place]

## 2017-04-30 ENCOUNTER — Encounter (HOSPITAL_COMMUNITY): Payer: Self-pay

## 2017-04-30 ENCOUNTER — Ambulatory Visit: Payer: Medicaid Other | Admitting: Infectious Disease

## 2017-04-30 ENCOUNTER — Ambulatory Visit (HOSPITAL_COMMUNITY)
Admission: RE | Admit: 2017-04-30 | Discharge: 2017-04-30 | Disposition: A | Discharge status: Home / Self Care | Payer: Medicaid Other | Source: Ambulatory Visit | Attending: Obstetrics and Gynecology | Admitting: Obstetrics and Gynecology

## 2017-04-30 ENCOUNTER — Encounter: Payer: Self-pay | Admitting: Obstetrics and Gynecology

## 2017-04-30 ENCOUNTER — Ambulatory Visit (INDEPENDENT_AMBULATORY_CARE_PROVIDER_SITE_OTHER): Payer: Medicaid Other | Admitting: Obstetrics and Gynecology

## 2017-04-30 ENCOUNTER — Other Ambulatory Visit (HOSPITAL_COMMUNITY): Payer: Self-pay | Admitting: Obstetrics and Gynecology

## 2017-04-30 ENCOUNTER — Other Ambulatory Visit (HOSPITAL_COMMUNITY)
Admission: RE | Admit: 2017-04-30 | Discharge: 2017-04-30 | Disposition: A | Discharge status: Home / Self Care | Payer: Medicaid Other | Source: Ambulatory Visit | Attending: Obstetrics and Gynecology | Admitting: Obstetrics and Gynecology

## 2017-04-30 VITALS — BP 101/63 | HR 104 | Wt 177.6 lb

## 2017-04-30 DIAGNOSIS — O99283 Endocrine, nutritional and metabolic diseases complicating pregnancy, third trimester: Secondary | ICD-10-CM | POA: Insufficient documentation

## 2017-04-30 DIAGNOSIS — E079 Disorder of thyroid, unspecified: Secondary | ICD-10-CM | POA: Diagnosis not present

## 2017-04-30 DIAGNOSIS — O09522 Supervision of elderly multigravida, second trimester: Secondary | ICD-10-CM

## 2017-04-30 DIAGNOSIS — B182 Chronic viral hepatitis C: Secondary | ICD-10-CM | POA: Diagnosis not present

## 2017-04-30 DIAGNOSIS — O0993 Supervision of high risk pregnancy, unspecified, third trimester: Secondary | ICD-10-CM | POA: Insufficient documentation

## 2017-04-30 DIAGNOSIS — O09523 Supervision of elderly multigravida, third trimester: Secondary | ICD-10-CM

## 2017-04-30 DIAGNOSIS — E0590 Thyrotoxicosis, unspecified without thyrotoxic crisis or storm: Secondary | ICD-10-CM

## 2017-04-30 DIAGNOSIS — B181 Chronic viral hepatitis B without delta-agent: Secondary | ICD-10-CM

## 2017-04-30 DIAGNOSIS — O24410 Gestational diabetes mellitus in pregnancy, diet controlled: Secondary | ICD-10-CM | POA: Insufficient documentation

## 2017-04-30 DIAGNOSIS — Z3A36 36 weeks gestation of pregnancy: Secondary | ICD-10-CM

## 2017-04-30 DIAGNOSIS — B1910 Unspecified viral hepatitis B without hepatic coma: Secondary | ICD-10-CM | POA: Insufficient documentation

## 2017-04-30 DIAGNOSIS — O0990 Supervision of high risk pregnancy, unspecified, unspecified trimester: Secondary | ICD-10-CM

## 2017-04-30 DIAGNOSIS — O99280 Endocrine, nutritional and metabolic diseases complicating pregnancy, unspecified trimester: Secondary | ICD-10-CM

## 2017-04-30 DIAGNOSIS — O98413 Viral hepatitis complicating pregnancy, third trimester: Secondary | ICD-10-CM

## 2017-04-30 DIAGNOSIS — E041 Nontoxic single thyroid nodule: Secondary | ICD-10-CM | POA: Insufficient documentation

## 2017-04-30 LAB — POCT URINALYSIS DIP (DEVICE)
Bilirubin Urine: NEGATIVE
Glucose, UA: NEGATIVE mg/dL
HGB URINE DIPSTICK: NEGATIVE
Ketones, ur: NEGATIVE mg/dL
NITRITE: NEGATIVE
PH: 7 (ref 5.0–8.0)
PROTEIN: 30 mg/dL — AB
SPECIFIC GRAVITY, URINE: 1.025 (ref 1.005–1.030)
UROBILINOGEN UA: 0.2 mg/dL (ref 0.0–1.0)

## 2017-04-30 NOTE — Progress Notes (Signed)
   PRENATAL VISIT NOTE  Subjective:  Michelle Logan is a 37 y.o. G2P1001 at 6965w2d being seen today for ongoing prenatal care.  She is currently monitored for the following issues for this high-risk pregnancy and has Chronic hepatitis B without delta agent without hepatic coma (HCC); Hyperthyroidism affecting pregnancy, antepartum; Supervision of high risk pregnancy, antepartum; AMA (advanced maternal age) multigravida 35+, second trimester; Heartburn during pregnancy, antepartum; and Gestational diabetes mellitus (GDM) on their problem list.  Patient reports no complaints.  Contractions: Not present. Vag. Bleeding: None.  Movement: Present. Denies leaking of fluid.   The following portions of the patient's history were reviewed and updated as appropriate: allergies, current medications, past family history, past medical history, past social history, past surgical history and problem list. Problem list updated.  Objective:   Vitals:   04/30/17 0817  BP: 101/63  Pulse: (!) 104  Weight: 177 lb 9.6 oz (80.6 kg)    Fetal Status: Fetal Heart Rate (bpm): 142   Movement: Present     General:  Alert, oriented and cooperative. Patient is in no acute distress.  Skin: Skin is warm and dry. No rash noted.   Cardiovascular: Normal heart rate noted  Respiratory: Normal respiratory effort, no problems with respiration noted  Abdomen: Soft, gravid, appropriate for gestational age.  Pain/Pressure: Present     Pelvic: Cervical exam deferred        Extremities: Normal range of motion.  Edema: None  Mental Status: Normal mood and affect. Normal behavior. Normal judgment and thought content.   Assessment and Plan:  Pregnancy: G2P1001 at 10465w2d  1. Hyperthyroidism affecting pregnancy, antepartum Stable no meds Repeat lab work today - TSH - T4, free - T3  2. Diet controlled gestational diabetes mellitus (GDM) in third trimester CBG well controlled, occasional outlier, cont diet cont Last growth  71st%tile Next growth today  3. Chronic hepatitis B without delta agent without hepatic coma (HCC) To see ID today  4. Supervision of high risk pregnancy, antepartum   5. AMA (advanced maternal age) multigravida 35+, second trimester   Preterm labor symptoms and general obstetric precautions including but not limited to vaginal bleeding, contractions, leaking of fluid and fetal movement were reviewed in detail with the patient. Please refer to After Visit Summary for other counseling recommendations.  Return in about 1 week (around 05/07/2017) for OB visit (MD).  Future Appointments  Date Time Provider Department Center  04/30/2017  9:45 AM WH-MFC US 2 WH-MFCUS MFC-US  04/30/2017  2:30 PM Daiva EvesVan Dam, Lisette Grinderornelius N, MD [facility] RC[facility]  05/14/2017  3:15 PM Constant, Gigi GinPeggy, MD WOC-WOCA WOC  05/21/2017  3:15 PM Conan Bowensavis, Kelly M, MD [facility]    Conan BowensKelly M Davis, MD

## 2017-05-01 LAB — GC/CHLAMYDIA PROBE AMP (~~LOC~~) NOT AT ARMC
Chlamydia: NEGATIVE
NEISSERIA GONORRHEA: NEGATIVE

## 2017-05-01 LAB — TSH: TSH: 0.887 u[IU]/mL (ref 0.450–4.500)

## 2017-05-01 LAB — T4, FREE: Free T4: 0.7 ng/dL — ABNORMAL LOW (ref 0.82–1.77)

## 2017-05-01 LAB — T3: T3, Total: 176 ng/dL (ref 71–180)

## 2017-05-03 LAB — CULTURE, BETA STREP (GROUP B ONLY): STREP GP B CULTURE: POSITIVE — AB

## 2017-05-07 ENCOUNTER — Ambulatory Visit (INDEPENDENT_AMBULATORY_CARE_PROVIDER_SITE_OTHER): Payer: Medicaid Other | Admitting: Infectious Disease

## 2017-05-07 ENCOUNTER — Encounter: Payer: Self-pay | Admitting: Infectious Disease

## 2017-05-07 VITALS — BP 106/69 | HR 109 | Temp 98.2°F | Ht 64.0 in | Wt 181.0 lb

## 2017-05-07 DIAGNOSIS — B951 Streptococcus, group B, as the cause of diseases classified elsewhere: Secondary | ICD-10-CM

## 2017-05-07 DIAGNOSIS — B181 Chronic viral hepatitis B without delta-agent: Secondary | ICD-10-CM

## 2017-05-07 LAB — COMPLETE METABOLIC PANEL WITH GFR
AG RATIO: 1.3 (calc) (ref 1.0–2.5)
ALT: 9 U/L (ref 6–29)
AST: 16 U/L (ref 10–30)
Albumin: 3 g/dL — ABNORMAL LOW (ref 3.6–5.1)
Alkaline phosphatase (APISO): 266 U/L — ABNORMAL HIGH (ref 33–115)
BILIRUBIN TOTAL: 0.4 mg/dL (ref 0.2–1.2)
BUN / CREAT RATIO: 7 (calc) (ref 6–22)
BUN: 5 mg/dL — AB (ref 7–25)
CALCIUM: 8.8 mg/dL (ref 8.6–10.2)
CO2: 21 mmol/L (ref 20–32)
Chloride: 109 mmol/L (ref 98–110)
Creat: 0.72 mg/dL (ref 0.50–1.10)
GFR, Est African American: 126 mL/min/{1.73_m2} (ref >=60)
GFR, Est Non African American: 108 mL/min/{1.73_m2} (ref >=60)
GLUCOSE: 144 mg/dL — AB (ref 65–99)
Globulin: 2.3 g/dL (calc) (ref 1.9–3.7)
Potassium: 3.7 mmol/L (ref 3.5–5.3)
SODIUM: 137 mmol/L (ref 135–146)
TOTAL PROTEIN: 5.3 g/dL — AB (ref 6.1–8.1)

## 2017-05-07 NOTE — Progress Notes (Signed)
Chief complaint: follow-up for chronic hepatitis B without delta agent in pregnant lady  Subjective:    Patient ID: Michelle Logan, female    DOB: 02-20-1980, 37 y.o.   MRN: [id number]  HPI  37 year old [place] lady who was diagnosed with Hepatitis B with pregnancy labs. She tested negative for HIV. Her Hep B DNA was only a few hundred copies. Her LFT's were normal. She was hep E ag antibody POSITIVE. She had baby without complications and we did not initiate treatment for HBV.   She now pregnant again and had labs done with OB which show HIV -, HBV DNA in low counts, normal LFTs.  She returns for followup and is due to deliver very soon. VL of HBV was in 300 copy range when last checked.         Past Medical History:  Diagnosis Date  . 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 Medical history non-contributory   . Pregnancy and infectious disease 12/21/2014  . Thyroid disease during pregnancy 12/21/2014  . UTI (urinary tract infection) 12/21/2014    Past Surgical History:  Procedure Laterality Date  . NO PAST SURGERIES      No family history on file.    Social History   Socioeconomic History  . Marital status: Married    Spouse name: Not on file  . Number of children: Not on file  . Years of education: Not on file  . Highest education level: Not on file  Occupational History  . Not on file  Social Needs  . Financial resource strain: Not on file  . Food insecurity:    Worry: Not on file    Inability: Not on file  . Transportation needs:    Medical: Not on file    Non-medical: Not on file  Tobacco Use  . Smoking status: Never Smoker  . Smokeless tobacco: Never Used  Substance and Sexual Activity  . Alcohol use: No    Alcohol/week: 0.0 oz  . Drug use: No  . Sexual activity: Yes    Partners: Male    Birth control/protection: None  Lifestyle  . Physical activity:    Days per week: Not on file    Minutes per  session: Not on file  . Stress: Not on file  Relationships  . Social connections:    Talks on phone: Not on file    Gets together: Not on file    Attends religious service: Not on file    Active member of club or organization: Not on file    Attends meetings of clubs or organizations: Not on file    Relationship status: Not on file  Other Topics Concern  . Not on file  Social History Narrative  . Not on file    No Known Allergies   Current Outpatient Medications:  .  pantoprazole (PROTONIX) 20 MG tablet, TAKE 1 TABLET(20 MG) BY MOUTH DAILY, Disp: 90 tablet, Rfl: 2 .  Prenat-Fe Poly-Methfol-FA-DHA (VITAFOL ULTRA) 29-0.6-0.4-200 MG CAPS, Take 1 tablet daily by mouth., Disp: 30 capsule, Rfl: 12 .  promethazine (PHENERGAN) 25 MG tablet, Take 1 tablet (25 mg total) by mouth every 6 (six) hours as needed for nausea or vomiting., Disp: 30 tablet, Rfl: 2 .  ACCU-CHEK FASTCLIX LANCETS MISC, 1 Device by Percutaneous route 4 (four) times daily. WC:B76.283 check BS QID, Disp: 100 each, Rfl: 12 .  Blood Glucose Monitoring Suppl (ACCU-CHEK AVIVA PLUS) w/Device KIT, 1 Device  by Does not apply route 4 (four) times daily., Disp: 1 kit, Rfl: 0 .  glucose blood (ACCU-CHEK GUIDE) test strip, Use as instructed, Disp: 100 each, Rfl: 12    Review of Systems  Constitutional: Positive for fatigue. Negative for activity change, appetite change, chills, diaphoresis, fever and unexpected weight change.  HENT: Negative for congestion, rhinorrhea, sinus pressure, sneezing, sore throat and trouble swallowing.   Eyes: Negative for photophobia and visual disturbance.  Respiratory: Negative for cough, chest tightness, shortness of breath, wheezing and stridor.   Cardiovascular: Negative for chest pain, palpitations and leg swelling.  Gastrointestinal: Negative for abdominal distention, abdominal pain, anal bleeding, blood in stool, constipation, diarrhea, nausea and vomiting.  Genitourinary: Negative for  difficulty urinating, dysuria, flank pain and hematuria.  Musculoskeletal: Negative for arthralgias, back pain, gait problem, joint swelling and myalgias.  Skin: Negative for color change, pallor, rash and wound.  Neurological: Negative for dizziness, tremors, weakness and light-headedness.  Hematological: Negative for adenopathy. Does not bruise/bleed easily.  Psychiatric/Behavioral: Negative for agitation, behavioral problems, confusion, decreased concentration, dysphoric mood and sleep disturbance.       Objective:   Physical Exam  Constitutional: She is oriented to person, place, and time. She appears well-developed and well-nourished. No distress.  HENT:  Head: Normocephalic and atraumatic.  Mouth/Throat: No oropharyngeal exudate.  Eyes: Conjunctivae and EOM are normal. No scleral icterus.  Neck: Normal range of motion. Neck supple.  Cardiovascular: Normal rate and regular rhythm.  Pulmonary/Chest: Effort normal. No respiratory distress. She has no wheezes.  Abdominal: Soft. Bowel sounds are normal.  Musculoskeletal: She exhibits no edema or tenderness.  Neurological: She is alert and oriented to person, place, and time. She exhibits normal muscle tone. Coordination normal.  Skin: Skin is warm and dry. No rash noted. She is not diaphoretic. No erythema. No pallor.  Psychiatric: She has a normal mood and affect. Her behavior is normal. Judgment and thought content normal.    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 Routine post partum care for HBV  + mother as far as her infant goes. No indication for ARV  Group B strep carrier: Ob tested and found this and plan on abx at delivery  I would like her to come back in 6 months to see me and for her husband to be checked for HBV.     Alcide Evener, MD

## 2017-05-08 LAB — HEPATITIS B DNA, ULTRAQUANTITATIVE, PCR
HEPATITIS B DNA (CALC): 2.06 {Log_IU}/mL — AB
Hepatitis B DNA: 114 IU/mL — ABNORMAL HIGH

## 2017-05-14 ENCOUNTER — Ambulatory Visit (INDEPENDENT_AMBULATORY_CARE_PROVIDER_SITE_OTHER): Payer: Medicaid Other | Admitting: Obstetrics and Gynecology

## 2017-05-14 ENCOUNTER — Encounter: Payer: Self-pay | Admitting: Obstetrics and Gynecology

## 2017-05-14 VITALS — BP 101/62 | HR 105 | Wt 181.8 lb

## 2017-05-14 DIAGNOSIS — O99280 Endocrine, nutritional and metabolic diseases complicating pregnancy, unspecified trimester: Secondary | ICD-10-CM

## 2017-05-14 DIAGNOSIS — O24410 Gestational diabetes mellitus in pregnancy, diet controlled: Secondary | ICD-10-CM

## 2017-05-14 DIAGNOSIS — O09523 Supervision of elderly multigravida, third trimester: Secondary | ICD-10-CM

## 2017-05-14 DIAGNOSIS — E0590 Thyrotoxicosis, unspecified without thyrotoxic crisis or storm: Secondary | ICD-10-CM

## 2017-05-14 DIAGNOSIS — O99820 Streptococcus B carrier state complicating pregnancy: Secondary | ICD-10-CM

## 2017-05-14 DIAGNOSIS — O0990 Supervision of high risk pregnancy, unspecified, unspecified trimester: Secondary | ICD-10-CM

## 2017-05-14 DIAGNOSIS — B181 Chronic viral hepatitis B without delta-agent: Secondary | ICD-10-CM

## 2017-05-14 LAB — POCT URINALYSIS DIP (DEVICE)
Glucose, UA: NEGATIVE mg/dL
NITRITE: NEGATIVE
PROTEIN: 100 mg/dL — AB
Specific Gravity, Urine: 1.025 (ref 1.005–1.030)
UROBILINOGEN UA: 1 mg/dL (ref 0.0–1.0)
pH: 7 (ref 5.0–8.0)

## 2017-05-14 NOTE — Progress Notes (Signed)
   PRENATAL VISIT NOTE  Subjective:  Michelle Logan is a 37 y.o. G2P1001 at [redacted]w[redacted]d being seen today for ongoing prenatal care.  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  Patient reports no complaints.  Contractions: Not present. Vag. Bleeding: None.  Movement: Present. Denies leaking of fluid.   The following portions of the patient's history were reviewed and updated as appropriate: allergies, current medications, past family history, past medical history, past social history, past surgical history and problem list. Problem list updated.  Objective:   Vitals:   05/14/17 1527  BP: 101/62  Pulse: (!) 105  Weight: 181 lb 12.8 oz (82.5 kg)    Fetal Status: Fetal Heart Rate (bpm): 150 Fundal Height: 38 cm Movement: Present     General:  Alert, oriented and cooperative. Patient is in no acute distress.  Skin: Skin is warm and dry. No rash noted.   Cardiovascular: Normal heart rate noted  Respiratory: Normal respiratory effort, no problems with respiration noted  Abdomen: Soft, gravid, appropriate for gestational age.  Pain/Pressure: Present     Pelvic: Cervical exam deferred        Extremities: Normal range of motion.  Edema: Moderate pitting, indentation subsides rapidly  Mental Status: Normal mood and affect. Normal behavior. Normal judgment and thought content.   Assessment and Plan:  Pregnancy: G2P1001 at [redacted]w[redacted]d  1. Supervision  of high risk pregnancy, antepartum Patient is doing well without complaints  2. Diet controlled gestational diabetes mellitus (GDM) in third trimester Patient did not bring log but reports fasting in 70's and pp in 100's with occasional after dinner values as high as 125 Continue diet control 4/24 growth ultrasound reviewed IOL at 40 weeks  3. Hyperthyroidism affecting pregnancy, antepartum Normal TSH on 4/24 No medication  4. Chronic hepatitis B without delta agent without hepatic coma (HCC) Followed bu ID  5. Advanced maternal age in multigravida, third trimester Normal NIPS  6. GBS (group B Streptococcus carrier), +RV culture, currently pregnant Prophylaxis in labor  Term labor symptoms and general obstetric precautions including but not limited to vaginal bleeding, contractions, leaking of fluid and fetal movement were reviewed in detail with the patient. Please refer to After Visit Summary for other counseling recommendations.  Return in about 1 week (around 05/21/2017) for ROB.  Future Appointments  Date Time Provider Department Center  05/21/2017  3:15 PM Conan Bowens, MD [facility]  11/10/2017 10:00 AM Daiva Eves, Lisette Grinder, MD [facility]    Catalina Antigua, MD

## 2017-05-20 ENCOUNTER — Inpatient Hospital Stay (HOSPITAL_COMMUNITY)
Admission: AD | Admit: 2017-05-20 | Discharge: 2017-05-22 | DRG: 806 | Disposition: A | Discharge status: Home / Self Care | Payer: Medicaid Other | Source: Ambulatory Visit | Attending: Family Medicine | Admitting: Family Medicine

## 2017-05-20 ENCOUNTER — Encounter (HOSPITAL_COMMUNITY): Payer: Self-pay | Admitting: *Deleted

## 2017-05-20 DIAGNOSIS — Z3A39 39 weeks gestation of pregnancy: Secondary | ICD-10-CM

## 2017-05-20 DIAGNOSIS — O9842 Viral hepatitis complicating childbirth: Secondary | ICD-10-CM | POA: Diagnosis present

## 2017-05-20 DIAGNOSIS — Z3490 Encounter for supervision of normal pregnancy, unspecified, unspecified trimester: Secondary | ICD-10-CM | POA: Insufficient documentation

## 2017-05-20 DIAGNOSIS — O24410 Gestational diabetes mellitus in pregnancy, diet controlled: Secondary | ICD-10-CM

## 2017-05-20 DIAGNOSIS — B181 Chronic viral hepatitis B without delta-agent: Secondary | ICD-10-CM | POA: Diagnosis present

## 2017-05-20 DIAGNOSIS — O24419 Gestational diabetes mellitus in pregnancy, unspecified control: Secondary | ICD-10-CM | POA: Diagnosis present

## 2017-05-20 DIAGNOSIS — O99280 Endocrine, nutritional and metabolic diseases complicating pregnancy, unspecified trimester: Secondary | ICD-10-CM

## 2017-05-20 DIAGNOSIS — O09523 Supervision of elderly multigravida, third trimester: Secondary | ICD-10-CM

## 2017-05-20 DIAGNOSIS — O24420 Gestational diabetes mellitus in childbirth, diet controlled: Secondary | ICD-10-CM | POA: Diagnosis present

## 2017-05-20 DIAGNOSIS — O99820 Streptococcus B carrier state complicating pregnancy: Secondary | ICD-10-CM

## 2017-05-20 DIAGNOSIS — O99824 Streptococcus B carrier state complicating childbirth: Secondary | ICD-10-CM | POA: Diagnosis present

## 2017-05-20 DIAGNOSIS — O0990 Supervision of high risk pregnancy, unspecified, unspecified trimester: Secondary | ICD-10-CM

## 2017-05-20 DIAGNOSIS — E0590 Thyrotoxicosis, unspecified without thyrotoxic crisis or storm: Secondary | ICD-10-CM | POA: Diagnosis present

## 2017-05-20 LAB — TYPE AND SCREEN
ABO/RH(D): AB POS
Antibody Screen: NEGATIVE

## 2017-05-20 LAB — GLUCOSE, CAPILLARY: Glucose-Capillary: 92 mg/dL (ref 65–99)

## 2017-05-20 LAB — CBC
HEMATOCRIT: 32.2 % — AB (ref 36.0–46.0)
HEMOGLOBIN: 10.2 g/dL — AB (ref 12.0–15.0)
MCH: 24.2 pg — ABNORMAL LOW (ref 26.0–34.0)
MCHC: 31.7 g/dL (ref 30.0–36.0)
MCV: 76.3 fL — ABNORMAL LOW (ref 78.0–100.0)
Platelets: 246 10*3/uL (ref 150–400)
RBC: 4.22 MIL/uL (ref 3.87–5.11)
RDW: 15.7 % — ABNORMAL HIGH (ref 11.5–15.5)
WBC: 13.8 10*3/uL — AB (ref 4.0–10.5)

## 2017-05-20 LAB — RPR: RPR: NONREACTIVE

## 2017-05-20 MED ORDER — OXYTOCIN BOLUS FROM INFUSION: 500.0000 mL | Freq: Once | INTRAVENOUS | Status: DC

## 2017-05-20 MED ORDER — LIDOCAINE HCL (PF) 1 % IJ SOLN
30.0000 mL | INTRAMUSCULAR | Status: AC | PRN
  Administered 2017-05-20: 30 mL via SUBCUTANEOUS
  Filled 2017-05-20 (×2): qty 30

## 2017-05-20 MED ORDER — OXYTOCIN 40 UNITS IN LACTATED RINGERS INFUSION - SIMPLE MED
2.5000 [IU]/h | INTRAVENOUS | Status: DC
  Filled 2017-05-20 (×2): qty 1000

## 2017-05-20 MED ORDER — LACTATED RINGERS IV SOLN: 500.0000 mL | INTRAVENOUS | Status: DC | PRN

## 2017-05-20 MED ORDER — ONDANSETRON HCL 4 MG PO TABS: 4.0000 mg | ORAL_TABLET | ORAL | Status: DC | PRN

## 2017-05-20 MED ORDER — SOD CITRATE-CITRIC ACID 500-334 MG/5ML PO SOLN: 30.0000 mL | ORAL | Status: DC | PRN

## 2017-05-20 MED ORDER — BENZOCAINE-MENTHOL 20-0.5 % EX AERO
1.0000 "application " | INHALATION_SPRAY | CUTANEOUS | Status: DC | PRN
  Administered 2017-05-20: 1 via TOPICAL
  Filled 2017-05-20: qty 56

## 2017-05-20 MED ORDER — SIMETHICONE 80 MG PO CHEW: 80.0000 mg | CHEWABLE_TABLET | ORAL | Status: DC | PRN

## 2017-05-20 MED ORDER — FENTANYL CITRATE (PF) 100 MCG/2ML IJ SOLN
100.0000 ug | INTRAMUSCULAR | Status: DC | PRN
  Administered 2017-05-20 (×3): 100 ug via INTRAVENOUS
  Filled 2017-05-20 (×3): qty 2

## 2017-05-20 MED ORDER — OXYCODONE-ACETAMINOPHEN 5-325 MG PO TABS: 1.0000 | ORAL_TABLET | ORAL | Status: DC | PRN

## 2017-05-20 MED ORDER — ONDANSETRON HCL 4 MG/2ML IJ SOLN: 4.0000 mg | INTRAMUSCULAR | Status: DC | PRN

## 2017-05-20 MED ORDER — WITCH HAZEL-GLYCERIN EX PADS: 1.0000 "application " | MEDICATED_PAD | CUTANEOUS | Status: DC | PRN

## 2017-05-20 MED ORDER — SODIUM CHLORIDE 0.9 % IV SOLN
2.0000 g | Freq: Once | INTRAVENOUS | Status: AC
  Administered 2017-05-20: 2 g via INTRAVENOUS
  Filled 2017-05-20: qty 2

## 2017-05-20 MED ORDER — SENNOSIDES-DOCUSATE SODIUM 8.6-50 MG PO TABS
2.0000 | ORAL_TABLET | ORAL | Status: DC
  Administered 2017-05-21 – 2017-05-22 (×2): 2 via ORAL
  Filled 2017-05-20 (×2): qty 2

## 2017-05-20 MED ORDER — DIBUCAINE 1 % RE OINT: 1.0000 "application " | TOPICAL_OINTMENT | RECTAL | Status: DC | PRN

## 2017-05-20 MED ORDER — LACTATED RINGERS IV SOLN: INTRAVENOUS | Status: DC

## 2017-05-20 MED ORDER — ONDANSETRON HCL 4 MG/2ML IJ SOLN: 4.0000 mg | Freq: Four times a day (QID) | INTRAMUSCULAR | Status: DC | PRN

## 2017-05-20 MED ORDER — ACETAMINOPHEN 325 MG PO TABS: 650.0000 mg | ORAL_TABLET | ORAL | Status: DC | PRN

## 2017-05-20 MED ORDER — OXYTOCIN 40 UNITS IN LACTATED RINGERS INFUSION - SIMPLE MED: 2.5000 [IU]/h | INTRAVENOUS | Status: DC

## 2017-05-20 MED ORDER — ZOLPIDEM TARTRATE 5 MG PO TABS: 5.0000 mg | ORAL_TABLET | Freq: Every evening | ORAL | Status: DC | PRN

## 2017-05-20 MED ORDER — OXYCODONE-ACETAMINOPHEN 5-325 MG PO TABS: 2.0000 | ORAL_TABLET | ORAL | Status: DC | PRN

## 2017-05-20 MED ORDER — TETANUS-DIPHTH-ACELL PERTUSSIS 5-2.5-18.5 LF-MCG/0.5 IM SUSP: 0.5000 mL | Freq: Once | INTRAMUSCULAR | Status: DC

## 2017-05-20 MED ORDER — LACTATED RINGERS IV SOLN
INTRAVENOUS | Status: DC
  Administered 2017-05-20: 08:00:00 via INTRAVENOUS

## 2017-05-20 MED ORDER — IBUPROFEN 600 MG PO TABS
600.0000 mg | ORAL_TABLET | Freq: Four times a day (QID) | ORAL | Status: DC
  Administered 2017-05-21 – 2017-05-22 (×6): 600 mg via ORAL
  Filled 2017-05-20 (×7): qty 1

## 2017-05-20 MED ORDER — LIDOCAINE HCL (PF) 1 % IJ SOLN: 30.0000 mL | INTRAMUSCULAR | Status: DC | PRN

## 2017-05-20 MED ORDER — PRENATAL MULTIVITAMIN CH
1.0000 | ORAL_TABLET | Freq: Every day | ORAL | Status: DC
  Administered 2017-05-21 – 2017-05-22 (×2): 1 via ORAL
  Filled 2017-05-20 (×2): qty 1

## 2017-05-20 MED ORDER — DIPHENHYDRAMINE HCL 25 MG PO CAPS: 25.0000 mg | ORAL_CAPSULE | Freq: Four times a day (QID) | ORAL | Status: DC | PRN

## 2017-05-20 MED ORDER — COCONUT OIL OIL: 1.0000 "application " | TOPICAL_OIL | Status: DC | PRN

## 2017-05-20 NOTE — MAU Note (Signed)
Pt reports contractions every 2-3 mins. Denies LOF or vag bleeding. Reports good fetal movement. Denies cervical exam.

## 2017-05-20 NOTE — H&P (Signed)
Michelle Logan is a 37 y.o. female presenting for SOL. OB History    Gravida  2   Para  1   Term  1   Preterm      AB      Living  1     SAB      TAB      Ectopic      Multiple  0   Live Births  1          Past Medical History:  Diagnosis Date  . Chronic hepatitis B without delta agent without hepatic coma (HCC) 12/21/2014  . Medical history non-contributory   . Pregnancy and infectious disease 12/21/2014  . Thyroid disease during pregnancy 12/21/2014  . UTI (urinary tract infection) 12/21/2014   Past Surgical History:  Procedure Laterality Date  . NO PAST SURGERIES     Family History: family history is not on file. Social History:  reports that she has never smoked. She has never used smokeless tobacco. She reports that she does not drink alcohol or use drugs.     Maternal Diabetes: Yes:  Diabetes Type:  Diet controlled Genetic Screening: Normal Maternal Ultrasounds/Referrals: Normal Fetal Ultrasounds or other Referrals:  None Maternal Substance Abuse:  No Significant Maternal Medications:  None Significant Maternal Lab Results:  Lab values include: Group B Strep positive Other Comments:  None  ROS Maternal Medical History:  Reason for admission: Contractions.   Contractions: Onset was 3-5 hours ago.   Frequency: regular.   Perceived severity is strong.    Fetal activity: Perceived fetal activity is normal.   Last perceived fetal movement was within the past hour.    Prenatal complications: No bleeding.   Prenatal Complications - Diabetes: gestational. Diabetes is managed by diet.      Dilation: 6 Effacement (%): 90 Station: -2 Exam by:: A. Gagliardo, RN Blood pressure 132/86, pulse 99, temperature 98 F (36.7 C), temperature source Oral, resp. rate 18, height  (1.626 m), weight 81.2 kg (179 lb), last menstrual period 08/19/2016, SpO2 100 %, unknown if currently breastfeeding. Exam Physical Exam  Constitutional: She is oriented  to person, place, and time. She appears well-nourished. No distress.  HENT:  Head: Normocephalic and atraumatic.  Eyes: Pupils are equal, round, and reactive to light. EOM are normal.  Neurological: She is alert and oriented to person, place, and time.  Psychiatric: She has a normal mood and affect.    Prenatal labs: ABO, Rh: --/--/AB POS (05/14 0505) Antibody: NEG (05/14 0505) Rubella: 16.50 (01/09 0850) RPR: Non Reactive (03/07 0826)  HBsAg: Confirm. indicated (01/09 0850)  HIV: Non Reactive (03/07 0826)  GBS:     Assessment/Plan:  37yo G2P1101 at [redacted]w[redacted]d with SOL   1. Labor Progressing well, expectant management  2. Diet controlled gestational diabetes mellitus (GDM) in third trimester Continue diet control  3. Hyperthyroidism affecting pregnancy, antepartum Normal TSH on 4/24 No medication  4. 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 Advanced maternal age in multigravida, third trimester Normal NIPS  6. GBS (group B Streptococcus carrier), +RV culture, currently pregnant Intrapartum Penicillin  7. Contraception plan Undecided  8. Baby plan Boy - outpatient circumcision; breast/bottle   Felisa Bonier, MD, PGY-1 Family Medicine - [facility] [place] 05/20/2017, 7:23 AM

## 2017-05-20 NOTE — Anesthesia Pain Management Evaluation Note (Signed)
  CRNA Pain Management Visit Note  Patient: Michelle Logan, 37 y.o., female  "Hello I am a member of the anesthesia team at [facility]. We have an anesthesia team available at all times to provide care throughout the hospital, including epidural management and anesthesia for C-section. I don't know your plan for the delivery whether it a natural birth, water birth, IV sedation, nitrous supplementation, doula or epidural, but we want to meet your pain goals."   1.Was your pain managed to your expectations on prior hospitalizations?   yes  2.What is your expectation for pain management during this hospitalization?     Nursing support IV meds  3.How can we help you reach that goal? IV meds  Record the patient's initial score and the patient's pain goal.   Pain: 7/10  Pain Goal: 3/10 [facility] wants you to be able to say your pain was always managed very well.  Salome Arnt 05/20/2017

## 2017-05-20 NOTE — Progress Notes (Signed)
Subjective: Doing well. S/P AROM.   Objective: BP 125/75   Pulse 94   Temp 98 F (36.7 C) (Oral)   Resp 18   Ht  (1.626 m)   Wt 81.2 kg (179 lb)   LMP 08/19/2016   SpO2 100%   BMI 30.73 kg/m  No intake/output data recorded. No intake/output data recorded.  FHT:  FHR: 130 bpm, variability: moderate,  accelerations:  Present,  decelerations:  Absent UC:   regular, every 3-4 minutes SVE:   Dilation: 8 Effacement (%): 100 Station: -2 Exam by:: Raynelle Fanning Degele CNM  Labs: Lab Results  Component Value Date   WBC 13.8 (H) 05/20/2017   HGB 10.2 (L) 05/20/2017   HCT 32.2 (L) 05/20/2017   MCV 76.3 (L) 05/20/2017   PLT 246 05/20/2017    Assessment / Plan: Spontaneous labor, progressing normally. S/P AROM @ 0946. Now at 8/100/-2. Pain well controlled with IV pain medications.  Labor: S/P AROM, expectant management Preeclampsia:  N/A Fetal Wellbeing:  Category I Pain Control:  IV pain meds I/D:  Ampicillin for GBS ppx due to advanced labor at presentation Anticipated MOD:  NSVD  Michelle Logan 05/20/2017, 9:56 AM

## 2017-05-21 ENCOUNTER — Encounter: Payer: Medicaid Other | Admitting: Obstetrics and Gynecology

## 2017-05-21 LAB — GLUCOSE, CAPILLARY
GLUCOSE-CAPILLARY: 106 mg/dL — AB (ref 65–99)
GLUCOSE-CAPILLARY: 93 mg/dL (ref 65–99)

## 2017-05-21 NOTE — Lactation Note (Addendum)
This note was copied from a baby's chart. Lactation Consultation Note Baby 17 hrs old. Mom's 2nd child. Her 1st child now 35 yrs old, mom BF for 15 months w/o difficulty. Mom has good everted nipples. Colostrum noted.  Mom states baby wanting to feed all the time. Discussed newborn feeding habits and behaviors. Mom encouraged to feed baby 8-12 times/24 hours and with feeding cues.  Encouraged mom to be comfortable, use support while feeding. Assisted in positioning baby closer to mom cheeks to breast, widening flange. Discussed breast massage and transfer of BM.  Reviewed STS, and I&O. Reminded mom being positive for Hep. B to monitor for bleeding or cracked nipples, not to BF if noted. Pump and dump until healed.  Encouraged to call for assistance or questions.  [facility] brochure given w/resources, support groups and LC services.  Patient Name: Michelle Logan Date: 05/21/2017 Reason for consult: Initial assessment;Other (Comment)(Hep. B)   Maternal Data Has patient been taught Hand Expression?: Yes Does the patient have breastfeeding experience prior to this delivery?: Yes  Feeding Feeding Type: Breast Fed Length of feed: 15 min(still BF)  LATCH Score Latch: Grasps breast easily, tongue down, lips flanged, rhythmical sucking.  Audible Swallowing: A few with stimulation  Type of Nipple: Everted at rest and after stimulation  Comfort (Breast/Nipple): Soft / non-tender  Hold (Positioning): Assistance needed to correctly position infant at breast and maintain latch.  LATCH Score: 8  Interventions Interventions: Breast feeding basics reviewed;Adjust position;Support pillows;Skin to skin;Position options;Breast massage;Hand express;Breast compression  Lactation Tools Discussed/Used WIC Program: Yes   Consult Status Consult Status: Follow-up Date: 05/22/17 Follow-up type: In-patient    Charyl Dancer 05/21/2017, 4:22 AM

## 2017-05-21 NOTE — Progress Notes (Signed)
POSTPARTUM PROGRESS NOTE  Post Partum Day 1  Subjective:  Michelle Logan is a 37 y.o. [id number] s/p SVD at [redacted]w[redacted]d.  She reports she is doing well. No acute events overnight. She denies any problems with ambulating, voiding or po intake. Denies nausea or vomiting.  Pain is well controlled.  Lochia is nml.  Objective: Blood pressure 105/65, pulse 88, temperature 97.6 F (36.4 C), temperature source Oral, resp. rate 18, height  (1.626 m), weight 81.2 kg (179 lb), last menstrual period 08/19/2016, SpO2 100 %, unknown if currently breastfeeding.  Physical Exam:  General: alert, cooperative and no distress Chest: no respiratory distress Heart:regular rate, distal pulses intact Abdomen: soft, nontender,  Uterine Fundus: firm, appropriately tender DVT Evaluation: No calf swelling or tenderness Extremities: no edema Skin: warm, dry  Recent Labs    05/20/17 0505  HGB 10.2*  HCT 32.2*    Assessment/Plan: Michelle Logan is a 37 y.o. [id number] s/p SVD at [redacted]w[redacted]d   PPD#1 - Doing well  Routine postpartum care Contraception: Undecided Feeding: Breast/Bottle Dispo: Plan for discharge 05/21/2017   LOS: 1 day   [place] T NguyenMD 05/21/2017, 8:20 AM

## 2017-05-22 MED ORDER — IBUPROFEN 600 MG PO TABS: 600.0000 mg | ORAL_TABLET | Freq: Four times a day (QID) | ORAL | 0 refills | Status: DC | PRN

## 2017-05-22 NOTE — Discharge Instructions (Signed)
Vaginal Delivery, Care After °Refer to this sheet in the next few weeks. These instructions provide you with information about caring for yourself after vaginal delivery. Your health care provider may also give you more specific instructions. Your treatment has been planned according to current medical practices, but problems sometimes occur. Call your health care provider if you have any problems or questions. °What can I expect after the procedure? °After vaginal delivery, it is common to have: °· Some bleeding from your vagina. °· Soreness in your abdomen, your vagina, and the area of skin between your vaginal opening and your anus (perineum). °· Pelvic cramps. °· Fatigue. ° °Follow these instructions at home: °Medicines °· Take over-the-counter and prescription medicines only as told by your health care provider. °· If you were prescribed an antibiotic medicine, take it as told by your health care provider. Do not stop taking the antibiotic until it is finished. °Driving ° °· Do not drive or operate heavy machinery while taking prescription pain medicine. °· Do not drive for 24 hours if you received a sedative. °Lifestyle °· Do not drink alcohol. This is especially important if you are breastfeeding or taking medicine to relieve pain. °· Do not use tobacco products, including cigarettes, chewing tobacco, or e-cigarettes. If you need help quitting, ask your health care provider. °Eating and drinking °· Drink at least 8 eight-ounce glasses of water every day unless you are told not to by your health care provider. If you choose to breastfeed your baby, you may need to drink more water than this. °· Eat high-fiber foods every day. These foods may help prevent or relieve constipation. High-fiber foods include: °? Whole grain cereals and breads. °? Brown rice. °? Beans. °? Fresh fruits and vegetables. °Activity °· Return to your normal activities as told by your health care provider. Ask your health care provider  what activities are safe for you. °· Rest as much as possible. Try to rest or take a nap when your baby is sleeping. °· Do not lift anything that is heavier than your baby or 10 lb (4.5 kg) until your health care provider says that it is safe. °· Talk with your health care provider about when you can engage in sexual activity. This may depend on your: °? Risk of infection. °? Rate of healing. °? Comfort and desire to engage in sexual activity. °Vaginal Care °· If you have an episiotomy or a vaginal tear, check the area every day for signs of infection. Check for: °? More redness, swelling, or pain. °? More fluid or blood. °? Warmth. °? Pus or a bad smell. °· Do not use tampons or douches until your health care provider says this is safe. °· Watch for any blood clots that may pass from your vagina. These may look like clumps of dark red, brown, or black discharge. °General instructions °· Keep your perineum clean and dry as told by your health care provider. °· Wear loose, comfortable clothing. °· Wipe from front to back when you use the toilet. °· Ask your health care provider if you can shower or take a bath. If you had an episiotomy or a perineal tear during labor and delivery, your health care provider may tell you not to take baths for a certain length of time. °· Wear a bra that supports your breasts and fits you well. °· If possible, have someone help you with household activities and help care for your baby for at least a few days after   you leave the hospital. °· Keep all follow-up visits for you and your baby as told by your health care provider. This is important. °Contact a health care provider if: °· You have: °? Vaginal discharge that has a bad smell. °? Difficulty urinating. °? Pain when urinating. °? A sudden increase or decrease in the frequency of your bowel movements. °? More redness, swelling, or pain around your episiotomy or vaginal tear. °? More fluid or blood coming from your episiotomy or  vaginal tear. °? Pus or a bad smell coming from your episiotomy or vaginal tear. °? A fever. °? A rash. °? Little or no interest in activities you used to enjoy. °? Questions about caring for yourself or your baby. °· Your episiotomy or vaginal tear feels warm to the touch. °· Your episiotomy or vaginal tear is separating or does not appear to be healing. °· Your breasts are painful, hard, or turn red. °· You feel unusually sad or worried. °· You feel nauseous or you vomit. °· You pass large blood clots from your vagina. If you pass a blood clot from your vagina, save it to show to your health care provider. Do not flush blood clots down the toilet without having your health care provider look at them. °· You urinate more than usual. °· You are dizzy or light-headed. °· You have not breastfed at all and you have not had a menstrual period for 12 weeks after delivery. °· You have stopped breastfeeding and you have not had a menstrual period for 12 weeks after you stopped breastfeeding. °Get help right away if: °· You have: °? Pain that does not go away or does not get better with medicine. °? Chest pain. °? Difficulty breathing. °? Blurred vision or spots in your vision. °? Thoughts about hurting yourself or your baby. °· You develop pain in your abdomen or in one of your legs. °· You develop a severe headache. °· You faint. °· You bleed from your vagina so much that you fill two sanitary pads in one hour. °This information is not intended to replace advice given to you by your health care provider. Make sure you discuss any questions you have with your health care provider. °Document Released: 12/22/1999 Document Revised: 06/07/2015 Document Reviewed: 01/08/2015 °Elsevier Interactive Patient Education © 2018 [employer] ° °

## 2017-05-22 NOTE — Discharge Summary (Signed)
  OB Discharge Summary     Patient Name: Michelle Logan DOB: 03/22/1980 MRN: [id number]  Date of admission: 05/20/2017 Delivering MD: DEGELE, JULIE P   Date of discharge: 05/22/2017  Admitting diagnosis: 39.1 WEEKS CTX Intrauterine pregnancy: [redacted]w[redacted]d     Secondary diagnosis:  Active Problems:   Chronic hepatitis B without delta agent without hepatic coma (HCC)   Hyperthyroidism affecting pregnancy, antepartum   Supervision of high risk pregnancy, antepartum   Advanced maternal age in multigravida, third trimester   Gestational diabetes mellitus (GDM)   GBS (group B Streptococcus carrier), +RV culture, currently pregnant   NSVD (normal spontaneous vaginal delivery)  Additional problems: none     Discharge diagnosis: Term Pregnancy Delivered and GDM A1                                                                                                Post partum procedures:none  Augmentation: AROM  Complications: None  Hospital course:  Onset of Labor With Vaginal Delivery     37 y.o. yo G2P2002 at [redacted]w[redacted]d was admitted in Active Labor on 05/20/2017. She has not required meds for hyperthyroidism. Patient had an uncomplicated labor course as follows:  Membrane Rupture Time/Date: 9:46 AM ,05/20/2017   Intrapartum Procedures: Episiotomy:                                           Lacerations:  1st degree [2];Perineal [11]  Patient had a delivery of a Viable infant. 05/20/2017  Information for the patient's newborn:  Mccarter, Boy Jazlyne [[id number]]  Delivery Method: Vaginal, Spontaneous(Filed from Delivery Summary)    Pateint had an uncomplicated postpartum course.  She is ambulating, tolerating a regular diet, passing flatus, and urinating well. Patient is discharged home in stable condition on 05/22/17.   Physical exam  Vitals:   05/21/17 1100 05/21/17 1300 05/21/17 1842 05/22/17 0529  BP:  106/70 96/65 97/60  Pulse: 82  88 79  Resp: 17  18 18  Temp: 98 F (36.7 C)  98.3 F (36.8  C) 97.8 F (36.6 C)  TempSrc: Oral  Oral Oral  SpO2:      Weight:      Height:       General: alert and cooperative Lochia: appropriate Uterine Fundus: firm Incision: N/A DVT Evaluation: No evidence of DVT seen on physical exam. Labs: Lab Results  Component Value Date   WBC 13.8 (H) 05/20/2017   HGB 10.2 (L) 05/20/2017   HCT 32.2 (L) 05/20/2017   MCV 76.3 (L) 05/20/2017   PLT 246 05/20/2017   CMP Latest Ref Rng & Units 05/07/2017  Glucose 65 - 99 mg/dL 144(H)  BUN 7 - 25 mg/dL 5(L)  Creatinine 0.50 - 1.10 mg/dL 0.72  Sodium 135 - 146 mmol/L 137  Potassium 3.5 - 5.3 mmol/L 3.7  Chloride 98 - 110 mmol/L 109  CO2 20 - 32 mmol/L 21  Calcium 8.6 - 10.2 mg/dL 8.8  Total Protein 6.1 - 8.1 g/dL 5.3(L)  Total   Bilirubin 0.2 - 1.2 mg/dL 0.4  Alkaline Phos 39 - 117 IU/L -  AST 10 - 30 U/L 16  ALT 6 - 29 U/L 9    Discharge instruction: per After Visit Summary and "Baby and Me Booklet".  After visit meds:  Allergies as of 05/22/2017   No Known Allergies     Medication List    STOP taking these medications   ACCU-CHEK AVIVA PLUS w/Device Kit   ACCU-CHEK FASTCLIX LANCETS Misc   glucose blood test strip Commonly known as:  ACCU-CHEK GUIDE   pantoprazole 20 MG tablet Commonly known as:  PROTONIX   promethazine 25 MG tablet Commonly known as:  PHENERGAN     TAKE these medications   ibuprofen 600 MG tablet Commonly known as:  ADVIL,MOTRIN Take 1 tablet (600 mg total) by mouth every 6 (six) hours as needed.   VITAFOL ULTRA 29-0.6-0.4-200 MG Caps Take 1 tablet daily by mouth.       Diet: routine diet  Activity: Advance as tolerated. Pelvic rest for 6 weeks.   Outpatient follow up:6 weeks- will need GTT also Follow up Appt: Future Appointments  Date Time Provider [place]  07/01/2017  8:30 AM [place] Rake  07/01/2017  9:15 AM Ephraim Hamburger Rona Ravens, NP [facility]  11/10/2017 10:00 AM Tommy Medal, Lavell Islam, MD [facility]   Follow up  Visit:No follow-ups on file.  Postpartum contraception: Undecided  Newborn Data: Live born female  Birth Weight: 7 lb 0.5 oz (3190 g) APGAR: 8, 9  Newborn Delivery   Birth date/time:  05/20/2017 10:38:00 Delivery type:  Vaginal, Spontaneous     Baby Feeding: Breast Disposition:home with mother   05/22/2017 Serita Grammes, CNM 9:14 AM

## 2017-05-22 NOTE — Lactation Note (Signed)
This note was copied from a baby's chart. Lactation Consultation Note  Patient Name: Michelle Logan RUEAV'W Date: 05/22/2017  Mom reports baby is latching well and feeding without problems.  No questions at this time.  Discussed milk coming to volume and engorgement treatment.  Instructed to feed with any feeding cue using good breast massage during feeding.  Lactation outpatient services and support information reviewed and encouraged prn.   Maternal Data    Feeding Feeding Type: Breast Fed Length of feed: 0 min  LATCH Score Latch: (asked mom to call with next latch )                 Interventions    Lactation Tools Discussed/Used     Consult Status      [name] 05/22/2017, 10:24 AM

## 2017-05-26 ENCOUNTER — Inpatient Hospital Stay (HOSPITAL_COMMUNITY): Payer: Medicaid Other

## 2017-05-28 ENCOUNTER — Encounter: Payer: Self-pay | Admitting: *Deleted

## 2017-05-29 ENCOUNTER — Other Ambulatory Visit: Payer: Self-pay | Admitting: Obstetrics & Gynecology

## 2017-05-29 DIAGNOSIS — O26899 Other specified pregnancy related conditions, unspecified trimester: Secondary | ICD-10-CM

## 2017-05-29 DIAGNOSIS — R12 Heartburn: Principal | ICD-10-CM

## 2017-06-23 ENCOUNTER — Other Ambulatory Visit: Payer: Self-pay | Admitting: Obstetrics & Gynecology

## 2017-06-23 DIAGNOSIS — R12 Heartburn: Principal | ICD-10-CM

## 2017-06-23 DIAGNOSIS — O26899 Other specified pregnancy related conditions, unspecified trimester: Secondary | ICD-10-CM

## 2017-07-01 ENCOUNTER — Ambulatory Visit: Payer: Medicaid Other | Admitting: *Deleted

## 2017-07-01 ENCOUNTER — Ambulatory Visit (INDEPENDENT_AMBULATORY_CARE_PROVIDER_SITE_OTHER): Payer: Medicaid Other | Admitting: Nurse Practitioner

## 2017-07-01 ENCOUNTER — Encounter: Payer: Self-pay | Admitting: Nurse Practitioner

## 2017-07-01 DIAGNOSIS — Z1389 Encounter for screening for other disorder: Secondary | ICD-10-CM

## 2017-07-01 DIAGNOSIS — G5601 Carpal tunnel syndrome, right upper limb: Secondary | ICD-10-CM

## 2017-07-01 DIAGNOSIS — E049 Nontoxic goiter, unspecified: Secondary | ICD-10-CM

## 2017-07-01 DIAGNOSIS — O24439 Gestational diabetes mellitus in the puerperium, unspecified control: Secondary | ICD-10-CM

## 2017-07-01 MED ORDER — WRIST BRACE/RIGHT MEDIUM MISC: 0 refills | Status: DC

## 2017-07-01 NOTE — Patient Instructions (Addendum)
[facility] on [address]  Goiter A goiter is an enlarged thyroid gland. The thyroid gland is located in the lower front of the neck. The gland produces hormones that regulate mood, body temperature, pulse rate, and digestion. Most goiters are painless and are not a cause for serious concern. Goiters and conditions that cause goiters can be treated, if necessary. What are the causes? Causes of this condition include:  Diseases that attack healthy cells in your body (autoimmune diseases) and affect your thyroid function, such as: ? Graves disease. This causes too much thyroid hormone to be produced and it makes your thyroid overly active (hyperthyroidism). ? Hashimoto disease. This type of inflammation of the thyroid (thyroiditis) causes too little thyroid hormone to be produced and it makes your thyroid not active enough (hypothyroidism).  Other conditions that cause thyroiditis.  Nodular goiter. This means that there are one or more small growths on your thyroid. These can create too much thyroid hormone.  Pregnancy.  Thyroid cancer. This is rare.  Certain medicines.  Radiation exposure.  Iodine deficiency.  In some cases, the cause may not be known (idiopathic). What increases the risk? This condition is more likely to develop in:  People who have a family history of goiter.  Women.  People who do not get enough iodine in their diet.  People who are older than 40.  People who smoke tobacco.  What are the signs or symptoms? Common symptoms of this condition include:  Swelling in the lower part of the neck. This swelling can range from a very small bump to a large lump.  A tight feeling in the throat.  A hoarse voice.  Other symptoms include:  Coughing.  Wheezing.  Difficulty swallowing.  Difficulty breathing.  Bulging neck veins.  Dizziness.  In some cases, there are no symptoms and thyroid hormone levels may be  normal. When a goiter is the result of hyperthyroidism, symptoms may also include:  Nervousness or restlessness.  Inability to tolerate heat.  Unexplained weight loss.  Diarrhea.  Change in the texture of hair or skin.  Changes in heart beat, such as skipped beats, extra beats, or a rapid heart rate.  Loss of menstruation.  Shaky hands.  Increased appetite.  Sleep problems.  When a goiter is the result of hypothyroidism, symptoms may also include:  Feeling like you have no energy (lethargy).  Inability to tolerate cold.  Weight gain that is not explained by a change in diet or exercise habits.  Dry skin.  Coarse hair.  Menstrual irregularity.  Constipation.  Sadness or depression.  How is this diagnosed? This condition may be diagnosed with a medical history and physical exam. You may also have other tests, including:  Blood tests to check thyroid function.  Imaging tests, such as: ? Ultrasonography. ? CT scan. ? MRI. ? Thyroid scan. You will be given a safe radioactive injection, then images will be taken of your thyroid.  Tissue sample (biopsy) of the goiter or any nodules. This checks to see if the goiter or nodules are cancerous.  How is this treated? Treatment for this condition depends on the cause. Treatment may include:  Medicines to control your thyroid.  Anti-inflammatory or steroid medicines, if inflammation is the cause.  Iodine supplements or changes in diet, if the goiter is caused by iodine deficiency.  Radiation therapy.  Surgery to remove your thyroid.  In some cases, no treatment is necessary, and your health care provider will  monitor your condition at regular checkups. Follow these instructions at home:  Follow recommendations from your health care provider for any changes to your diet.  Take over-the-counter and prescription medicines only as told by your health care provider.  Do not use any tobacco products, including  cigarettes, chewing tobacco, or e-cigarettes. If you need help quitting, ask your health care provider.  Keep all follow-up appointments as told by your health care provider. This is important. Contact a health care provider if:  Your symptoms do not get better with treatment. Get help right away if:  You develop sudden, unexplained confusion or other mental changes.  You have nausea, vomiting, or diarrhea.  You develop a fever.  Your skin or the whites of your eyes appear yellow (jaundice).  You develop chest pain.  You have trouble breathing or swallowing.  You suddenly become very weak.  You experience extreme restlessness. This information is not intended to replace advice given to you by your health care provider. Make sure you discuss any questions you have with your health care provider. Document Released: 06/13/2009 Document Revised: 07/14/2015 Document Reviewed: 12/20/2013 Elsevier Interactive Patient Education  2018 [employer]  Goiter A goiter is an enlarged thyroid gland. The thyroid gland is located in the lower front of the neck. The gland produces hormones that regulate mood, body temperature, pulse rate, and digestion. Most goiters are painless and are not a cause for serious concern. Goiters and conditions that cause goiters can be treated, if necessary. What are the causes? Causes of this condition include:  Diseases that attack healthy cells in your body (autoimmune diseases) and affect your thyroid function, such as: ? Graves disease. This causes too much thyroid hormone to be produced and it makes your thyroid overly active (hyperthyroidism). ? Hashimoto disease. This type of inflammation of the thyroid (thyroiditis) causes too little thyroid hormone to be produced and it makes your thyroid not active enough (hypothyroidism).  Other conditions that cause thyroiditis.  Nodular goiter. This means that there are one or more small growths on your thyroid.  These can create too much thyroid hormone.  Pregnancy.  Thyroid cancer. This is rare.  Certain medicines.  Radiation exposure.  Iodine deficiency.  In some cases, the cause may not be known (idiopathic). What increases the risk? This condition is more likely to develop in:  People who have a family history of goiter.  Women.  People who do not get enough iodine in their diet.  People who are older than 40.  People who smoke tobacco.  What are the signs or symptoms? Common symptoms of this condition include:  Swelling in the lower part of the neck. This swelling can range from a very small bump to a large lump.  A tight feeling in the throat.  A hoarse voice.  Other symptoms include:  Coughing.  Wheezing.  Difficulty swallowing.  Difficulty breathing.  Bulging neck veins.  Dizziness.  In some cases, there are no symptoms and thyroid hormone levels may be normal. When a goiter is the result of hyperthyroidism, symptoms may also include:  Nervousness or restlessness.  Inability to tolerate heat.  Unexplained weight loss.  Diarrhea.  Change in the texture of hair or skin.  Changes in heart beat, such as skipped beats, extra beats, or a rapid heart rate.  Loss of menstruation.  Shaky hands.  Increased appetite.  Sleep problems.  When a goiter is the result of hypothyroidism, symptoms may also include:  Feeling like you  have no energy (lethargy).  Inability to tolerate cold.  Weight gain that is not explained by a change in diet or exercise habits.  Dry skin.  Coarse hair.  Menstrual irregularity.  Constipation.  Sadness or depression.  How is this diagnosed? This condition may be diagnosed with a medical history and physical exam. You may also have other tests, including:  Blood tests to check thyroid function.  Imaging tests, such as: ? Ultrasonography. ? CT scan. ? MRI. ? Thyroid scan. You will be given a safe radioactive  injection, then images will be taken of your thyroid.  Tissue sample (biopsy) of the goiter or any nodules. This checks to see if the goiter or nodules are cancerous.  How is this treated? Treatment for this condition depends on the cause. Treatment may include:  Medicines to control your thyroid.  Anti-inflammatory or steroid medicines, if inflammation is the cause.  Iodine supplements or changes in diet, if the goiter is caused by iodine deficiency.  Radiation therapy.  Surgery to remove your thyroid.  In some cases, no treatment is necessary, and your health care provider will monitor your condition at regular checkups. Follow these instructions at home:  Follow recommendations from your health care provider for any changes to your diet.  Take over-the-counter and prescription medicines only as told by your health care provider.  Do not use any tobacco products, including cigarettes, chewing tobacco, or e-cigarettes. If you need help quitting, ask your health care provider.  Keep all follow-up appointments as told by your health care provider. This is important. Contact a health care provider if:  Your symptoms do not get better with treatment. Get help right away if:  You develop sudden, unexplained confusion or other mental changes.  You have nausea, vomiting, or diarrhea.  You develop a fever.  Your skin or the whites of your eyes appear yellow (jaundice).  You develop chest pain.  You have trouble breathing or swallowing.  You suddenly become very weak.  You experience extreme restlessness. This information is not intended to replace advice given to you by your health care provider. Make sure you discuss any questions you have with your health care provider. Document Released: 06/13/2009 Document Revised: 07/14/2015 Document Reviewed: 12/20/2013 Elsevier Interactive Patient Education  [employer].

## 2017-07-01 NOTE — Progress Notes (Signed)
Subjective:     Michelle Logan is a 37 y.o. female who presents for a postpartum visit. She is 6 weeks postpartum following a spontaneous vaginal delivery. I have fully reviewed the prenatal and intrapartum course. The delivery was at 39 gestational weeks. Outcome: spontaneous vaginal delivery. Anesthesia: IV sedation. Postpartum course has been unremarkable. Baby's course has been good. Baby is feeding by breast. Bleeding staining only. Bowel function is normal. Bladder function is normal. Patient is not sexually active. Contraception method is none. Postpartum depression screening: negative.  Of note, had hyperthyroid treated with medication in her first pregnancy.  Eventually her doctor weaned her off thyroid medications.  In this last pregnancy, there was never a need for medication.  No elevated labs.  Has no symptoms of hyperthyroid today.  Additionally client had gestational diabetes in her prenatal course.  The following portions of the patient's history were reviewed and updated as appropriate: allergies, current medications, past family history, past medical history, past social history, past surgical history and problem list.  Review of Systems Pertinent items noted in HPI and remainder of comprehensive ROS otherwise negative.   Objective:    BP 107/72   Pulse 67   Ht 5\' 4"  (1.626 m)   Wt 152 lb 4.8 oz (69.1 kg)   Breastfeeding? Yes   BMI 26.14 kg/m   General:  alert, cooperative and no distress   Breasts:  inspection negative, no nipple discharge or bleeding, no masses or nodularity palpable and deferred as client reports no problems and is breastfeeding  Lungs: clear to auscultation bilaterally  Heart:  regular rate and rhythm, S1, S2 normal, no murmur, click, rub or gallop  Abdomen  Thyroid: soft, nontender,    Soft and no masses however right side is enlarged  Pelvic exam  - deferred     Assessment:    normal postpartum exam but client does have other medical  problems - carpal tunnel on the right and an enlarged thyroid. Gestational diabetes followup testing being done today. Pap smear not done at today's visit.   Plan:    1. Contraception: none - [place] at this time.  Expect the small amount of bleeding she is having to resolve in the next 1-2 weeks. 2. Brace ordered for carpal tunnel on the right 3.  Consult with Dr. Macon LargeAnyanwu re: plan for enlarged thyroid - asymmetrical - labs drawn to check for thyroid issues and since right side is enlarged and left side is not, will do ultrasound of thyroid. 3. Follow up in: 12 months or as needed.   4.  Given name of office to establish with as she will not have insurance - [facility]. 5.  Postpartum gestational diabetes testing done today as follow up.  Labs pending.

## 2017-07-02 LAB — GLUCOSE TOLERANCE, 2 HOURS
Glucose, 2 hour: 81 mg/dL (ref 65–139)
Glucose, GTT - Fasting: 74 mg/dL (ref 65–99)

## 2017-07-02 LAB — TSH: TSH: 0.355 u[IU]/mL — AB (ref 0.450–4.500)

## 2017-07-02 LAB — T3, FREE: T3, Free: 3.1 pg/mL (ref 2.0–4.4)

## 2017-07-02 LAB — T4: T4, Total: 5.2 ug/dL (ref 4.5–12.0)

## 2017-07-04 ENCOUNTER — Ambulatory Visit (HOSPITAL_COMMUNITY)
Admission: RE | Admit: 2017-07-04 | Discharge: 2017-07-04 | Disposition: A | Discharge status: Home / Self Care | Payer: Medicaid Other | Source: Ambulatory Visit | Attending: Nurse Practitioner | Admitting: Nurse Practitioner

## 2017-07-04 DIAGNOSIS — E049 Nontoxic goiter, unspecified: Secondary | ICD-10-CM | POA: Insufficient documentation

## 2017-11-10 ENCOUNTER — Ambulatory Visit (INDEPENDENT_AMBULATORY_CARE_PROVIDER_SITE_OTHER): Payer: Medicaid Other | Admitting: Infectious Diseases

## 2017-11-10 ENCOUNTER — Encounter: Payer: Self-pay | Admitting: Infectious Diseases

## 2017-11-10 VITALS — BP 103/69 | HR 72 | Temp 97.9°F | Ht 64.0 in | Wt 144.0 lb

## 2017-11-10 DIAGNOSIS — Z23 Encounter for immunization: Secondary | ICD-10-CM

## 2017-11-10 DIAGNOSIS — B181 Chronic viral hepatitis B without delta-agent: Secondary | ICD-10-CM

## 2017-11-10 NOTE — Progress Notes (Signed)
Patient received Flu and prevnar-13 today, tolerated well.

## 2017-11-10 NOTE — Progress Notes (Signed)
Patient: Michelle Logan  DOB: 1980-09-01 MRN: [id number] PCP: Kalman Jewels, MD   Patient Active Problem List   Diagnosis Date Noted  . Thyroid disease affecting pregnancy   . Gestational diabetes mellitus (GDM) 03/26/2017  . Chronic hepatitis B without delta agent without hepatic coma (HCC) 12/21/2014     Subjective:  Michelle Logan is a 37 y.o. [place] woman here today for follow up for her chronic hepatitis b infection. She is here with her 2 young children. She last saw Dr. Daiva Eves prior to her birth of her son in May. She had an uneventful delivery and now continues to do very well. She is not taking any new medications and currently not on treatment for her hepatitis b. She denies any abdominal pain, changes to her skin/eyes, urine or bowel movements.   Review of Systems  All other systems reviewed and are negative.   Past Medical History:  Diagnosis Date  . Chronic hepatitis B without delta agent without hepatic coma (HCC) 12/21/2014  . Medical history non-contributory   . Pregnancy and infectious disease 12/21/2014  . Thyroid disease during pregnancy 12/21/2014  . UTI (urinary tract infection) 12/21/2014    Outpatient Medications Prior to Visit  Medication Sig Dispense Refill  . Elastic Bandages & Supports (WRIST BRACE/RIGHT MEDIUM) MISC Wrist brace for carpal tunnel for right hand 1 each 0  . ibuprofen (ADVIL,MOTRIN) 600 MG tablet Take 1 tablet (600 mg total) by mouth every 6 (six) hours as needed. 30 tablet 0  . pantoprazole (PROTONIX) 20 MG tablet TAKE 1 TABLET(20 MG) BY MOUTH DAILY 30 tablet 0  . Prenat-Fe Poly-Methfol-FA-DHA (VITAFOL ULTRA) 29-0.6-0.4-200 MG CAPS Take 1 tablet daily by mouth. 30 capsule 12   No facility-administered medications prior to visit.      No Known Allergies   Objective:   Vitals:   11/10/17 1027  BP: 103/69  Pulse: 72  Temp: 97.9 F (36.6 C)  Weight: 144 lb (65.3 kg)  Height: 5\' 4"  (1.626 m)   Body mass index is  24.72 kg/m.  Physical Exam  Constitutional: She is oriented to person, place, and time. She appears well-developed and well-nourished.  Seated comfortably in chair.   HENT:  Mouth/Throat: Mucous membranes are normal. No oral lesions. Normal dentition. No dental abscesses. No oropharyngeal exudate.  Cardiovascular: Normal rate, regular rhythm and normal heart sounds.  Pulmonary/Chest: Effort normal and breath sounds normal.  Abdominal: Soft. She exhibits no distension. There is no tenderness.  Lymphadenopathy:    She has no cervical adenopathy.  Neurological: She is alert and oriented to person, place, and time.  Skin: Skin is warm and dry. No rash noted.  Psychiatric: She has a normal mood and affect. Judgment normal.  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 Baby has had appropriate prophylaxis per her report.  Her HBV DNA levels have remained < 2,000 copies and normal ALTs dating back to 2016. Non-invasive staging of liver disease indicating very low risk for fibrosis (estimated Metavir < F2) - APRI - 0.2 and FIB-4 -0.71.  Will redraw her HBV DNA, LFTs, HBeAb today. She should have ALT +/- Hep B DNA monitored twice a year and HBsAg annually - very low likelihood she will clear.  No indication for treatment pending labs drawn today. She can follow up with a repeat lab draw only in 6 months and visit with Dr. Daiva Eves in 1 year.  HCC screening to begin at 35yo based on AALSD guidelines (no family h/o HCC, no cirrhosis and likely infected at early age).  Flu and Prevnar vaccine today; will follow with Pneumovax > 8 weeks. Hep A immune.  I will communicate results to her via Mychart.       Relevant Orders   Hepatitis B DNA, ultraquantitative, PCR   Hepatitis B e antibody    Comprehensive metabolic panel (Completed)    Other Visit Diagnoses    Need for vaccination with 13-polyvalent pneumococcal conjugate vaccine    -  Primary   Relevant Orders   Pneumococcal conjugate vaccine 13-valent IM (Completed)   Need for immunization against influenza       Relevant Orders   Flu Vaccine QUAD 36+ mos IM (Completed)     Rexene Alberts, MSN, NP-C [facility] Pager: [phone] Office: [phone]  11/11/17  1:46 PM

## 2017-11-10 NOTE — Assessment & Plan Note (Addendum)
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 Baby has had appropriate prophylaxis per her report.  Her HBV DNA levels have remained < 2,000 copies and normal ALTs dating back to 2016. Non-invasive staging of liver disease indicating very low risk for fibrosis (estimated Metavir < F2) - APRI - 0.2 and FIB-4 -0.71.  Will redraw her HBV DNA, LFTs, HBeAb today. She should have ALT +/- Hep B DNA monitored twice a year and HBsAg annually - very low likelihood she will clear. No indication for treatment pending labs drawn today. She can follow up with a repeat lab draw only in 6 months and visit with Dr. Daiva Eves in 1 year.  HCC screening to begin at 35yo based on AALSD guidelines (no family h/o HCC, no cirrhosis and likely infected at early age).  Flu and Prevnar vaccine today; will follow with Pneumovax > 8 weeks. Hep A immune.  I will communicate results to her via Mychart.

## 2017-11-10 NOTE — Patient Instructions (Addendum)
Will check your blood work today and plan on having you back in 6 months with Dr. Daiva Eves.   Will send you a MyChart message with your results.

## 2017-11-11 LAB — COMPREHENSIVE METABOLIC PANEL
AG RATIO: 1.7 (calc) (ref 1.0–2.5)
ALT: 13 U/L (ref 6–29)
AST: 18 U/L (ref 10–30)
Albumin: 4.3 g/dL (ref 3.6–5.1)
Alkaline phosphatase (APISO): 94 U/L (ref 33–115)
BILIRUBIN TOTAL: 0.6 mg/dL (ref 0.2–1.2)
BUN: 9 mg/dL (ref 7–25)
CALCIUM: 9.4 mg/dL (ref 8.6–10.2)
CO2: 28 mmol/L (ref 20–32)
Chloride: 106 mmol/L (ref 98–110)
Creat: 0.74 mg/dL (ref 0.50–1.10)
Globulin: 2.6 g/dL (calc) (ref 1.9–3.7)
Glucose, Bld: 77 mg/dL (ref 65–99)
Potassium: 4.1 mmol/L (ref 3.5–5.3)
Sodium: 140 mmol/L (ref 135–146)
Total Protein: 6.9 g/dL (ref 6.1–8.1)

## 2017-11-11 LAB — HEPATITIS B DNA, ULTRAQUANTITATIVE, PCR
HEPATITIS B DNA (CALC): 3.52 {Log_IU}/mL — AB
HEPATITIS B DNA: 3290 [IU]/mL — AB

## 2017-11-11 LAB — HEPATITIS B E ANTIBODY: HEP B E AB: REACTIVE — AB

## 2018-01-09 ENCOUNTER — Other Ambulatory Visit: Payer: Self-pay | Admitting: Advanced Practice Midwife

## 2018-01-09 DIAGNOSIS — O0990 Supervision of high risk pregnancy, unspecified, unspecified trimester: Secondary | ICD-10-CM

## 2018-05-11 ENCOUNTER — Ambulatory Visit: Payer: Medicaid Other | Admitting: Infectious Disease

## 2018-07-27 ENCOUNTER — Other Ambulatory Visit: Payer: Self-pay | Admitting: Advanced Practice Midwife

## 2018-07-27 DIAGNOSIS — O219 Vomiting of pregnancy, unspecified: Secondary | ICD-10-CM

## 2018-08-10 ENCOUNTER — Encounter: Payer: Self-pay | Admitting: *Deleted

## 2018-08-24 ENCOUNTER — Ambulatory Visit (INDEPENDENT_AMBULATORY_CARE_PROVIDER_SITE_OTHER): Payer: Medicaid Other | Admitting: *Deleted

## 2018-08-24 ENCOUNTER — Other Ambulatory Visit: Payer: Self-pay

## 2018-08-24 DIAGNOSIS — B181 Chronic viral hepatitis B without delta-agent: Secondary | ICD-10-CM

## 2018-08-24 DIAGNOSIS — O09299 Supervision of pregnancy with other poor reproductive or obstetric history, unspecified trimester: Secondary | ICD-10-CM | POA: Insufficient documentation

## 2018-08-24 DIAGNOSIS — Z8632 Personal history of gestational diabetes: Secondary | ICD-10-CM

## 2018-08-24 DIAGNOSIS — O09529 Supervision of elderly multigravida, unspecified trimester: Secondary | ICD-10-CM | POA: Insufficient documentation

## 2018-08-24 DIAGNOSIS — O0990 Supervision of high risk pregnancy, unspecified, unspecified trimester: Secondary | ICD-10-CM | POA: Insufficient documentation

## 2018-08-24 MED ORDER — BLOOD PRESSURE KIT DEVI: 1.0000 | 0 refills | Status: DC | PRN

## 2018-08-24 NOTE — Progress Notes (Signed)
0922 I called Connie for her New OB intake visit today and left a message I was calling for her visit and will call again in a few minutes; please be available by phone. Linda,RN  I connected with  Cendy Decou on 08/24/18 at  9:30 AM EDT by telephone and verified that I am speaking with the correct person using two identifiers.   I discussed the limitations, risks, security and privacy concerns of performing an evaluation and management service by telephone and the availability of in person appointments. I also discussed with the patient that there may be a patient responsible charge related to this service. The patient expressed understanding and agreed to proceed.  She has MyChart  App .    Explained we will send her Babyscripts app- app sent to her while on phone.  Explained we will send a blood pressure cuff to her and the pharmacy that will send it will call her to verify her address.  I also verified her address. Explained we will have her take her blood pressure weekly and enter into the app. Explained she will have some visits in office and some virtually. Reviewed appointment date/ time with her , our location and to wear mask, no visitors. Explained she will have exam, ob bloodwork, hemoglobin a1C, cbg , genetic testing if desired, pap if needed. Explained we will schedule an [place] at 19 weeks and she will get notification via [facility]. She voices understanding.  Linda,RN 08/24/2018  9:32 AM

## 2018-08-24 NOTE — Progress Notes (Signed)
Patient seen and assessed by nursing staff.  Agree with documentation and plan.  

## 2018-08-31 ENCOUNTER — Encounter: Payer: Self-pay | Admitting: Family Medicine

## 2018-08-31 ENCOUNTER — Encounter: Payer: Self-pay | Admitting: General Practice

## 2018-08-31 ENCOUNTER — Other Ambulatory Visit (HOSPITAL_COMMUNITY)
Admission: RE | Admit: 2018-08-31 | Discharge: 2018-08-31 | Disposition: A | Discharge status: Home / Self Care | Payer: Medicaid Other | Source: Ambulatory Visit | Attending: Family Medicine | Admitting: Family Medicine

## 2018-08-31 ENCOUNTER — Ambulatory Visit (INDEPENDENT_AMBULATORY_CARE_PROVIDER_SITE_OTHER): Payer: Medicaid Other | Admitting: Family Medicine

## 2018-08-31 ENCOUNTER — Other Ambulatory Visit: Payer: Self-pay

## 2018-08-31 VITALS — BP 97/71 | HR 89 | Wt 151.0 lb

## 2018-08-31 DIAGNOSIS — O0990 Supervision of high risk pregnancy, unspecified, unspecified trimester: Secondary | ICD-10-CM

## 2018-08-31 DIAGNOSIS — O09521 Supervision of elderly multigravida, first trimester: Secondary | ICD-10-CM

## 2018-08-31 DIAGNOSIS — Z3A13 13 weeks gestation of pregnancy: Secondary | ICD-10-CM

## 2018-08-31 DIAGNOSIS — O0991 Supervision of high risk pregnancy, unspecified, first trimester: Secondary | ICD-10-CM

## 2018-08-31 DIAGNOSIS — O09291 Supervision of pregnancy with other poor reproductive or obstetric history, first trimester: Secondary | ICD-10-CM

## 2018-08-31 DIAGNOSIS — Z8632 Personal history of gestational diabetes: Secondary | ICD-10-CM

## 2018-08-31 DIAGNOSIS — O09299 Supervision of pregnancy with other poor reproductive or obstetric history, unspecified trimester: Secondary | ICD-10-CM

## 2018-08-31 DIAGNOSIS — E079 Disorder of thyroid, unspecified: Secondary | ICD-10-CM

## 2018-08-31 DIAGNOSIS — O99281 Endocrine, nutritional and metabolic diseases complicating pregnancy, first trimester: Secondary | ICD-10-CM | POA: Diagnosis not present

## 2018-08-31 DIAGNOSIS — B181 Chronic viral hepatitis B without delta-agent: Secondary | ICD-10-CM | POA: Diagnosis not present

## 2018-08-31 LAB — POCT URINALYSIS DIP (DEVICE)
Bilirubin Urine: NEGATIVE
Glucose, UA: NEGATIVE mg/dL
Hgb urine dipstick: NEGATIVE
Ketones, ur: NEGATIVE mg/dL
Nitrite: NEGATIVE
Protein, ur: 30 mg/dL — AB
Specific Gravity, Urine: 1.02 (ref 1.005–1.030)
Urobilinogen, UA: 0.2 mg/dL (ref 0.0–1.0)
pH: 7.5 (ref 5.0–8.0)

## 2018-08-31 NOTE — Progress Notes (Signed)
Subjective:   Michelle Logan is a 38 y.o. G3P2002 at 26w0dby LMP being seen today for her first obstetrical visit.  Her obstetrical history is significant for advanced maternal age and prior GDM. Patient does intend to breast feed. Pregnancy history fully reviewed.  Patient reports nausea.  HISTORY: OB History  Gravida Para Term Preterm AB Living  '3 2 2 '$ 0 0 2  SAB TAB Ectopic Multiple Live Births  0 0 0 0 2    # Outcome Date GA Lbr Len/2nd Weight Sex Delivery Anes PTL Lv  3 Current           2 Term 05/20/17 325w1d 00:08 7 lb 0.5 oz (3.19 kg) M Vag-Spont Local  LIV     Birth Comments: None     Name: Bellisario,BOY Corinne     Apgar1: 8  Apgar5: 9  1 Term 05/02/15 3866w0d:46 / 00:23 5 lb 10.5 oz (2.565 kg) F Vag-Spont Other  LIV     Birth Comments: no complications     Name: Lupton,GIRL Aaliyana     Apgar1: 9  Apgar5: 9   Last pap smear was  2016 and was normal Past Medical History:  Diagnosis Date  . Chronic [place] delta agent [place]  . Medical history non-contributory   . Pregnancy and infectious disease 12/21/2014  . Thyroid disease during pregnancy 12/21/2014  . UTI (urinary tract infection) 12/21/2014   Past Surgical History:  Procedure Laterality Date  . NO PAST SURGERIES     No family history on file. Social History   Tobacco Use  . Smoking status: Never Smoker  . Smokeless tobacco: Never Used  Substance Use Topics  . Alcohol use: No    Alcohol/week: 0.0 standard drinks  . Drug use: No   No Known Allergies Current Outpatient Medications on File Prior to Visit  Medication Sig Dispense Refill  . acetaminophen (TYLENOL) 325 MG tablet Take 650 mg by mouth every 6 (six) hours as needed.    . Blood Pressure Monitoring (BLOOD PRESSURE KIT) DEVI 1 Device by Does not apply route as needed. ICD 10:  O09.90 1 Device 0  . Prenat-Fe Poly-Methfol-FA-DHA (VITAFOL ULTRA) 29-0.6-0.4-200 MG CAPS TAKE 1 CAPSULE BY MOUTH  DAILY 30 capsule 12  . promethazine (PHENERGAN) 25 MG tablet TAKE 1 TABLET(25 MG) BY MOUTH EVERY 6 HOURS AS NEEDED FOR NAUSEA OR VOMITING 30 tablet 2   No current facility-administered medications on file prior to visit.      Exam   Vitals:   08/31/18 1054  BP: 97/71  Pulse: 89  Weight: 151 lb (68.5 kg)   Fetal Heart Rate (bpm): 165  Uterus:  Fundal Height: 14 cm  Pelvic Exam: Perineum: no hemorrhoids, normal perineum   Vulva: normal external genitalia, no lesions   Vagina:  normal mucosa, normal discharge   Cervix: no lesions and normal, pap smear done.    Adnexa: normal adnexa and no mass, fullness, tenderness   Bony Pelvis: average  System: General: well-developed, well-nourished female in no acute distress   Breast:  normal appearance, no masses or tenderness   Skin: normal coloration and turgor, no rashes   Neurologic: oriented, normal, negative, normal mood   Extremities: normal strength, tone, and muscle mass, ROM of all joints is normal   HEENT PERRLA, extraocular movement intact and sclera clear, anicteric   Mouth/Teeth mucous membranes moist, pharynx normal without lesions and dental hygiene good   Neck  supple and no masses   Cardiovascular: regular rate and rhythm   Respiratory:  no respiratory distress, normal breath sounds   Abdomen: soft, non-tender; bowel sounds normal; no masses,  no organomegaly     Assessment:   Pregnancy: [id number] Patient Active Problem List   Diagnosis Date Noted  . Supervision of high risk pregnancy, antepartum 08/24/2018  . Advanced maternal age in multigravida 08/24/2018  . History of gestational diabetes in prior pregnancy, currently pregnant 08/24/2018  . Thyroid disease affecting pregnancy   . Chronic hepatitis B without delta agent without hepatic coma ([place]) 12/21/2014     Plan:  1. Supervision of high risk pregnancy, antepartum New OB labs - Culture, OB Urine - Genetic Screening - Hemoglobin A1c - Obstetric Panel,  Including HIV - Cytology - PAP( [place])  2. Thyroid disease affecting pregnancy Will check TFTs, needed meds with 1st pregnancy, then none needed with second - TSH - T3, free - T4, free  3. Chronic hepatitis B without delta agent without hepatic coma (HCC) Last VL low, see [facility] - Comprehensive metabolic panel - Hepatitis B DNA, ultraquantitative, PCR  4. Multigravida of advanced maternal age in first trimester NIPT ordered today  5. History of gestational diabetes in prior pregnancy, currently pregnant A1C and fasting CBG ordered   Initial labs drawn. Continue prenatal vitamins. Genetic Screening discussed, NIPS: ordered. Ultrasound discussed; fetal anatomic survey: ordered. Problem list reviewed and updated. The nature of [place] with multiple MDs and other Advanced Practice Providers was explained to patient; also emphasized that residents, students are part of our team. Routine obstetric precautions reviewed. No follow-ups on file.

## 2018-09-01 LAB — OBSTETRIC PANEL, INCLUDING HIV
Antibody Screen: NEGATIVE
Basophils Absolute: 0 10*3/uL (ref 0.0–0.2)
Basos: 0 %
EOS (ABSOLUTE): 0 10*3/uL (ref 0.0–0.4)
Eos: 0 %
HIV Screen 4th Generation wRfx: NONREACTIVE
Hematocrit: 38.8 % (ref 34.0–46.6)
Hemoglobin: 12.9 g/dL (ref 11.1–15.9)
Immature Grans (Abs): 0 10*3/uL (ref 0.0–0.1)
Immature Granulocytes: 0 %
Lymphocytes Absolute: 2 10*3/uL (ref 0.7–3.1)
Lymphs: 27 %
MCH: 28.1 pg (ref 26.6–33.0)
MCHC: 33.2 g/dL (ref 31.5–35.7)
MCV: 85 fL (ref 79–97)
Monocytes Absolute: 0.6 10*3/uL (ref 0.1–0.9)
Monocytes: 8 %
Neutrophils Absolute: 4.6 10*3/uL (ref 1.4–7.0)
Neutrophils: 65 %
Platelets: 176 10*3/uL (ref 150–450)
RBC: 4.59 x10E6/uL (ref 3.77–5.28)
RDW: 13.1 % (ref 11.7–15.4)
RPR Ser Ql: NONREACTIVE
Rh Factor: POSITIVE
Rubella Antibodies, IGG: 20.6 index (ref >0.99)
WBC: 7.3 10*3/uL (ref 3.4–10.8)

## 2018-09-01 LAB — HEMOGLOBIN A1C
Est. average glucose Bld gHb Est-mCnc: 103 mg/dL
Hgb A1c MFr Bld: 5.2 % (ref 4.8–5.6)

## 2018-09-01 LAB — HEPATITIS B SURFACE AG, CONFIRM: HBsAG Confirmation: POSITIVE — AB

## 2018-09-01 LAB — COMPREHENSIVE METABOLIC PANEL
ALT: 12 IU/L (ref 0–32)
AST: 15 IU/L (ref 0–40)
Albumin/Globulin Ratio: 1.7 (ref 1.2–2.2)
Albumin: 4.2 g/dL (ref 3.8–4.8)
Alkaline Phosphatase: 57 IU/L (ref 39–117)
BUN/Creatinine Ratio: 10 (ref 9–23)
BUN: 5 mg/dL — ABNORMAL LOW (ref 6–20)
Bilirubin Total: <0.2 mg/dL (ref 0.0–1.2)
CO2: 23 mmol/L (ref 20–29)
Calcium: 9.2 mg/dL (ref 8.7–10.2)
Chloride: 102 mmol/L (ref 96–106)
Creatinine, Ser: 0.5 mg/dL — ABNORMAL LOW (ref 0.57–1.00)
GFR calc Af Amer: 144 mL/min/{1.73_m2} (ref >59)
GFR calc non Af Amer: 125 mL/min/{1.73_m2} (ref >59)
Globulin, Total: 2.5 g/dL (ref 1.5–4.5)
Glucose: 70 mg/dL (ref 65–99)
Potassium: 4.2 mmol/L (ref 3.5–5.2)
Sodium: 138 mmol/L (ref 134–144)
Total Protein: 6.7 g/dL (ref 6.0–8.5)

## 2018-09-01 LAB — TSH: TSH: 0.122 u[IU]/mL — ABNORMAL LOW (ref 0.450–4.500)

## 2018-09-01 LAB — HEPATITIS B DNA, ULTRAQUANTITATIVE, PCR
HBV DNA SERPL PCR-ACNC: 2510 IU/mL
HBV DNA SERPL PCR-LOG IU: 3.4 log10 IU/mL

## 2018-09-01 LAB — T3, FREE: T3, Free: 3.9 pg/mL (ref 2.0–4.4)

## 2018-09-01 LAB — T4, FREE: Free T4: 1.09 ng/dL (ref 0.82–1.77)

## 2018-09-02 LAB — CYTOLOGY - PAP
Adequacy: ABSENT
Chlamydia: NEGATIVE
Diagnosis: NEGATIVE
HPV: NOT DETECTED
Neisseria Gonorrhea: NEGATIVE
Trichomonas: NEGATIVE

## 2018-09-02 LAB — GLUCOSE, CAPILLARY: Glucose-Capillary: 67 mg/dL — ABNORMAL LOW (ref 70–99)

## 2018-09-06 LAB — CULTURE, OB URINE

## 2018-09-06 LAB — URINE CULTURE, OB REFLEX

## 2018-09-09 ENCOUNTER — Ambulatory Visit (INDEPENDENT_AMBULATORY_CARE_PROVIDER_SITE_OTHER): Payer: Medicaid Other | Admitting: Infectious Disease

## 2018-09-09 ENCOUNTER — Encounter: Payer: Self-pay | Admitting: Infectious Disease

## 2018-09-09 ENCOUNTER — Other Ambulatory Visit: Payer: Self-pay

## 2018-09-09 ENCOUNTER — Encounter: Payer: Self-pay | Admitting: *Deleted

## 2018-09-09 DIAGNOSIS — B181 Chronic viral hepatitis B without delta-agent: Secondary | ICD-10-CM

## 2018-09-09 DIAGNOSIS — O0990 Supervision of high risk pregnancy, unspecified, unspecified trimester: Secondary | ICD-10-CM | POA: Diagnosis not present

## 2018-09-09 NOTE — Progress Notes (Signed)
Verbal orders per Dr. Tommy Medal schedule patient to receive Menveo and Flu vaccine.  Michelle Logan Patient called and scheduled for vaccines. Michelle Logan

## 2018-09-09 NOTE — Progress Notes (Signed)
Patient ID: Michelle Logan, female   DOB: 12/23/1980, 38 y.o.   MRN: [id number]  Virtual Visit via Telephone Note  I connected with Michelle Logan on 09/09/18 at  1:45 PM EDT by telephone and verified that I am speaking with the correct person using two identifiers.  Location: Patient: Home Provider: Home   I discussed the limitations, risks, security and privacy concerns of performing an evaluation and management service by telephone and the availability of in person appointments. I also discussed with the patient that there may be a patient responsible charge related to this service. The patient expressed understanding and agreed to proceed.  Chief complaint: Follow-up for chronic hepatitis B and pregnant woman  History of Present Illness:  Michelle Logan is a 38 year old African woman who is pregnant has chronic hepatitis B virus infection without delta agent.  She normal LFTs the DNA when checked a few weeks ago was only a 2500 copies.  This spring she saw Michelle Logan and received appropriate vaccinations with flu shot and shot and new flu shot is otherwise doing well and on 12 point review of systems   Observations/Objective:  38 year old lady doing well currently pregnant, who does have hepatitis B infection which would meet criteria for treatment of her on her pregnancy  Assessment and Plan:  Chronic Hepatitis B infection in pregnant lady: I will have her come back to clinic in late October to recheck her liver function test and hepatitis B DNA we will follow her roughly every 3 months making sure we are checking before it is due to the liver.  I suspect she will continue to maintain low level viremia and not require treatment though it is possible of course that she could have a flare during.  If she remains with her current level of hepatitis B viremia normal LFTs she would not require treatment but her baby would of course need to be vaccinated after delivery with hepatitis B  vaccine and hepatitis B immunoglobulin  Need for vaccines I would have her come in for a flu shot and a Pneumovax    Follow Up Instructions:    I discussed the assessment and treatment plan with the patient. The patient was provided an opportunity to ask questions and all were answered. The patient agreed with the plan and demonstrated an understanding of the instructions.   The patient was advised to call back or seek an in-person evaluation if the symptoms worsen or if the condition fails to improve as anticipated.  I provided 36 minutes of non-face-to-face time during this encounter.   Alcide Evener, MD

## 2018-09-17 ENCOUNTER — Ambulatory Visit (INDEPENDENT_AMBULATORY_CARE_PROVIDER_SITE_OTHER): Payer: Medicaid Other

## 2018-09-17 ENCOUNTER — Other Ambulatory Visit: Payer: Self-pay

## 2018-09-17 DIAGNOSIS — Z23 Encounter for immunization: Secondary | ICD-10-CM | POA: Diagnosis present

## 2018-09-18 ENCOUNTER — Encounter: Payer: Self-pay | Admitting: *Deleted

## 2018-09-29 ENCOUNTER — Telehealth (INDEPENDENT_AMBULATORY_CARE_PROVIDER_SITE_OTHER): Payer: Medicaid Other | Admitting: Student

## 2018-09-29 ENCOUNTER — Other Ambulatory Visit: Payer: Self-pay

## 2018-09-29 VITALS — BP 105/62 | HR 96

## 2018-09-29 DIAGNOSIS — O219 Vomiting of pregnancy, unspecified: Secondary | ICD-10-CM

## 2018-09-29 DIAGNOSIS — O0992 Supervision of high risk pregnancy, unspecified, second trimester: Secondary | ICD-10-CM

## 2018-09-29 DIAGNOSIS — O99282 Endocrine, nutritional and metabolic diseases complicating pregnancy, second trimester: Secondary | ICD-10-CM | POA: Diagnosis not present

## 2018-09-29 DIAGNOSIS — O09522 Supervision of elderly multigravida, second trimester: Secondary | ICD-10-CM

## 2018-09-29 DIAGNOSIS — E079 Disorder of thyroid, unspecified: Secondary | ICD-10-CM

## 2018-09-29 DIAGNOSIS — Z3A17 17 weeks gestation of pregnancy: Secondary | ICD-10-CM

## 2018-09-29 DIAGNOSIS — O0990 Supervision of high risk pregnancy, unspecified, unspecified trimester: Secondary | ICD-10-CM

## 2018-09-29 DIAGNOSIS — O09299 Supervision of pregnancy with other poor reproductive or obstetric history, unspecified trimester: Secondary | ICD-10-CM

## 2018-09-29 DIAGNOSIS — O09292 Supervision of pregnancy with other poor reproductive or obstetric history, second trimester: Secondary | ICD-10-CM

## 2018-09-29 DIAGNOSIS — O09521 Supervision of elderly multigravida, first trimester: Secondary | ICD-10-CM

## 2018-09-29 DIAGNOSIS — Z8632 Personal history of gestational diabetes: Secondary | ICD-10-CM

## 2018-09-29 MED ORDER — ASPIRIN EC 81 MG PO TBEC: 81.0000 mg | DELAYED_RELEASE_TABLET | Freq: Every day | ORAL | 3 refills | Status: DC

## 2018-09-29 MED ORDER — PROMETHAZINE HCL 25 MG PO TABS: ORAL_TABLET | ORAL | 2 refills | Status: DC

## 2018-09-29 NOTE — Progress Notes (Signed)
    I connected with@ on 09/29/18 at  9:35 AM EDT by: [name] nd verified that I am speaking with the correct person using two identifiers.  Patient is located at home and provider is located at [facility].    The purpose of this virtual visit is to provide medical care while limiting exposure to the novel coronavirus. I discussed the limitations, risks, security and privacy concerns of performing an evaluation and management service by My Chart and the availability of in person appointments. I also discussed with the patient that there may be a patient responsible charge related to this service. By engaging in this virtual visit, you consent to the provision of healthcare.  Additionally, you authorize for your insurance to be billed for the services provided during this visit.  The patient expressed understanding and agreed to proceed.  The following staff members participated in the virtual visit:  Carver Fila    PRENATAL VISIT NOTE  Subjective:  Michelle Logan is a 38 y.o. G3P2002 at [redacted]w[redacted]d  for phone visit for ongoing prenatal care.  She is currently monitored for the following issues for this high-risk pregnancy and has Chronic hepatitis B without delta agent without hepatic coma ([place]); Thyroid disease affecting pregnancy; Supervision of high risk pregnancy, antepartum; Advanced maternal age in multigravida; and History of gestational diabetes in prior pregnancy, currently pregnant on their problem list.  Patient reports no complaints.  Contractions: Not present. Vag. Bleeding: None.  Movement: Absent. Denies leaking of fluid.   The following portions of the patient's history were reviewed and updated as appropriate: allergies, current medications, past family history, past medical history, past social history, past surgical history and problem list.   Objective:   Vitals:   09/29/18 0950  BP: 105/62  Pulse: 96   Self-Obtained  Fetal Status:     Movement: Absent        Assessment and Plan:  Pregnancy: G3P2002 at [redacted]w[redacted]d 1. Supervision of high risk pregnancy, antepartum -Draw AFP at [place] appt on oct 5, as well as recheck TFTs and FreeT3 and T4 (she will be in her 2nd trimester then) - AFP, Serum, Open Spina Bifida; Future  2. Multigravida of advanced maternal age in first trimester  -She didn't receive baby RX but has been tracking her BPs, reviewed warning signs reviewed -9-11: BP 108/68, pulse 94 -9-20 BP: 104/64, pulse 94   3. History of gestational diabetes in prior pregnancy, currently pregnant -Started on baby ASA  4. Nausea/vomiting in pregnancy -Phenergan renewed - promethazine (PHENERGAN) 25 MG tablet; TAKE 1 TABLET(25 MG) BY MOUTH EVERY 6 HOURS AS NEEDED FOR NAUSEA OR VOMITING  Dispense: 30 tablet; Refill: 2  Preterm labor symptoms and general obstetric precautions including but not limited to vaginal bleeding, contractions, leaking of fluid and fetal movement were reviewed in detail with the patient.  Return in about 3 weeks (around 10/20/2018) for  [place] visit with MD in three weeks as well as lab appt on Oct 5 when she comes for [place].  Future Appointments  Date Time Provider [place]  10/12/2018 10:30 AM Drew [facility]  10/12/2018 10:30 AM WH-MFC [place] 1 WH-MFCUS MFC-US  11/05/2018  9:30 AM Truman Hayward, MD [facility]  09/21/2019  9:45 AM Tommy Medal, Lavell Islam, MD [facility]     Time spent on virtual visit: 30 minutes  [place], CNM

## 2018-10-12 ENCOUNTER — Ambulatory Visit (HOSPITAL_COMMUNITY): Payer: Medicaid Other | Admitting: *Deleted

## 2018-10-12 ENCOUNTER — Other Ambulatory Visit (HOSPITAL_COMMUNITY): Payer: Self-pay | Admitting: *Deleted

## 2018-10-12 ENCOUNTER — Encounter (HOSPITAL_COMMUNITY): Payer: Self-pay

## 2018-10-12 ENCOUNTER — Ambulatory Visit (HOSPITAL_COMMUNITY)
Admission: RE | Admit: 2018-10-12 | Discharge: 2018-10-12 | Disposition: A | Discharge status: Home / Self Care | Payer: Medicaid Other | Source: Ambulatory Visit | Attending: Obstetrics and Gynecology | Admitting: Obstetrics and Gynecology

## 2018-10-12 ENCOUNTER — Other Ambulatory Visit: Payer: Medicaid Other

## 2018-10-12 ENCOUNTER — Other Ambulatory Visit: Payer: Self-pay

## 2018-10-12 DIAGNOSIS — O99282 Endocrine, nutritional and metabolic diseases complicating pregnancy, second trimester: Secondary | ICD-10-CM | POA: Diagnosis not present

## 2018-10-12 DIAGNOSIS — O0990 Supervision of high risk pregnancy, unspecified, unspecified trimester: Secondary | ICD-10-CM

## 2018-10-12 DIAGNOSIS — O09299 Supervision of pregnancy with other poor reproductive or obstetric history, unspecified trimester: Secondary | ICD-10-CM | POA: Diagnosis present

## 2018-10-12 DIAGNOSIS — O09522 Supervision of elderly multigravida, second trimester: Secondary | ICD-10-CM

## 2018-10-12 DIAGNOSIS — O98412 Viral hepatitis complicating pregnancy, second trimester: Secondary | ICD-10-CM | POA: Diagnosis not present

## 2018-10-12 DIAGNOSIS — B181 Chronic viral hepatitis B without delta-agent: Secondary | ICD-10-CM | POA: Insufficient documentation

## 2018-10-12 DIAGNOSIS — B1910 Unspecified viral hepatitis B without hepatic coma: Secondary | ICD-10-CM

## 2018-10-12 DIAGNOSIS — O09529 Supervision of elderly multigravida, unspecified trimester: Secondary | ICD-10-CM | POA: Diagnosis present

## 2018-10-12 DIAGNOSIS — E079 Disorder of thyroid, unspecified: Secondary | ICD-10-CM

## 2018-10-12 DIAGNOSIS — Z3A19 19 weeks gestation of pregnancy: Secondary | ICD-10-CM

## 2018-10-12 DIAGNOSIS — O09292 Supervision of pregnancy with other poor reproductive or obstetric history, second trimester: Secondary | ICD-10-CM

## 2018-10-12 DIAGNOSIS — Z8632 Personal history of gestational diabetes: Secondary | ICD-10-CM | POA: Insufficient documentation

## 2018-10-12 DIAGNOSIS — O99280 Endocrine, nutritional and metabolic diseases complicating pregnancy, unspecified trimester: Secondary | ICD-10-CM

## 2018-10-15 LAB — AFP, SERUM, OPEN SPINA BIFIDA
AFP MoM: 1.18
AFP Value: 63.2 ng/mL
Gest. Age on Collection Date: 19 weeks
Maternal Age At EDD: 36.8 yr
OSBR Risk 1 IN: 10000
Test Results:: NEGATIVE
Weight: 156 [lb_av]

## 2018-10-15 LAB — TSH: TSH: 0.091 u[IU]/mL — ABNORMAL LOW (ref 0.450–4.500)

## 2018-10-15 LAB — T3, FREE: T3, Free: 3.4 pg/mL (ref 2.0–4.4)

## 2018-10-15 LAB — T4, FREE: Free T4: 0.94 ng/dL (ref 0.82–1.77)

## 2018-10-19 ENCOUNTER — Other Ambulatory Visit: Payer: Self-pay

## 2018-10-19 ENCOUNTER — Telehealth (INDEPENDENT_AMBULATORY_CARE_PROVIDER_SITE_OTHER): Payer: Medicaid Other | Admitting: Obstetrics and Gynecology

## 2018-10-19 VITALS — BP 103/51 | HR 92

## 2018-10-19 DIAGNOSIS — E079 Disorder of thyroid, unspecified: Secondary | ICD-10-CM

## 2018-10-19 DIAGNOSIS — O99281 Endocrine, nutritional and metabolic diseases complicating pregnancy, first trimester: Secondary | ICD-10-CM | POA: Diagnosis not present

## 2018-10-19 DIAGNOSIS — O0990 Supervision of high risk pregnancy, unspecified, unspecified trimester: Secondary | ICD-10-CM

## 2018-10-19 DIAGNOSIS — O98411 Viral hepatitis complicating pregnancy, first trimester: Secondary | ICD-10-CM | POA: Diagnosis not present

## 2018-10-19 DIAGNOSIS — O09521 Supervision of elderly multigravida, first trimester: Secondary | ICD-10-CM

## 2018-10-19 DIAGNOSIS — O09522 Supervision of elderly multigravida, second trimester: Secondary | ICD-10-CM

## 2018-10-19 DIAGNOSIS — O0991 Supervision of high risk pregnancy, unspecified, first trimester: Secondary | ICD-10-CM

## 2018-10-19 DIAGNOSIS — B181 Chronic viral hepatitis B without delta-agent: Secondary | ICD-10-CM | POA: Diagnosis not present

## 2018-10-19 DIAGNOSIS — Z3A10 10 weeks gestation of pregnancy: Secondary | ICD-10-CM

## 2018-10-20 NOTE — Progress Notes (Signed)
   TELEHEALTH VIRTUAL OBSTETRICS VISIT ENCOUNTER NOTE  Clinic: [facility]  I connected with Michelle Logan on 10/19/18 at  4:15 PM EDT by telephone at home and verified that I am speaking with the correct person using two identifiers.   I discussed the limitations, risks, security and privacy concerns of performing an evaluation and management service by telephone and the availability of in person appointments. I also discussed with the patient that there may be a patient responsible charge related to this service. The patient expressed understanding and agreed to proceed.  Subjective:  Michelle Logan is a 38 y.o. G3P2002 at [redacted]w[redacted]d being followed for ongoing prenatal care.  She is currently monitored for the following issues for this high-risk pregnancy and has Chronic hepatitis B without delta agent without hepatic coma ([place]); Thyroid disease affecting pregnancy; Supervision of high risk pregnancy, antepartum; Advanced maternal age in multigravida; and History of gestational diabetes in prior pregnancy, currently pregnant on their problem list.  Patient reports no complaints. Reports fetal movement. Denies any contractions, bleeding or leaking of fluid.   The following portions of the patient's history were reviewed and updated as appropriate: allergies, current medications, past family history, past medical history, past social history, past surgical history and problem list.   Objective:   Vitals:   10/19/18 1549  BP: (!) 103/51  Pulse: 92    Babyscripts Data Reviewed: not applicable  General:  Alert, oriented and cooperative.   Mental Status: Normal mood and affect perceived. Normal judgment and thought content.  Rest of physical exam deferred due to type of encounter  Assessment and Plan:  Pregnancy: G3P2002 at [redacted]w[redacted]d 1. Chronic hepatitis B without delta agent without hepatic coma (HCC) Followed by ID. Last note September 2020  2. Thyroid disease  affecting pregnancy Recheck TFTs with 28wk labs. Doing well on no meds  3. Multigravida of advanced maternal age in second trimester Neg afp, cffdna, anatomy u/s  4. Supervision of high risk pregnancy, antepartum Routine care. Ask about BTL with 28wk labs  Preterm labor symptoms and general obstetric precautions including but not limited to vaginal bleeding, contractions, leaking of fluid and fetal movement were reviewed in detail with the patient.  I discussed the assessment and treatment plan with the patient. The patient was provided an opportunity to ask questions and all were answered. The patient agreed with the plan and demonstrated an understanding of the instructions. The patient was advised to call back or seek an in-person office evaluation/go to [facility] at [facility] for any urgent or concerning symptoms. Please refer to After Visit Summary for other counseling recommendations.   I provided 10 minutes of non-face-to-face time during this encounter. The visit was conducted via [facility]  Return in about 5 weeks (around 11/23/2018) for high risk, in person.  Future Appointments  Date Time Provider [place]  11/05/2018  9:30 AM Tommy Medal, Lavell Islam, MD [facility]  11/23/2018 10:45 AM WH-MFC NURSE [place] [facility]  11/23/2018 10:45 AM WH-MFC [place] 2 WH-MFCUS M[facility]-US  09/21/2019  9:45 AM Truman Hayward, MD [facility]    Aletha Halim, MD [facility], [place]

## 2018-11-04 ENCOUNTER — Encounter: Payer: Self-pay | Admitting: Infectious Disease

## 2018-11-04 ENCOUNTER — Other Ambulatory Visit: Payer: Self-pay

## 2018-11-04 ENCOUNTER — Ambulatory Visit (INDEPENDENT_AMBULATORY_CARE_PROVIDER_SITE_OTHER): Payer: Medicaid Other | Admitting: Infectious Disease

## 2018-11-04 VITALS — BP 100/68 | HR 96 | Temp 98.6°F | Wt 161.0 lb

## 2018-11-04 DIAGNOSIS — B181 Chronic viral hepatitis B without delta-agent: Secondary | ICD-10-CM | POA: Diagnosis not present

## 2018-11-04 DIAGNOSIS — O0990 Supervision of high risk pregnancy, unspecified, unspecified trimester: Secondary | ICD-10-CM

## 2018-11-04 NOTE — Progress Notes (Signed)
Subjective:   Chief complaint: followup for otitis B infection in a pregnant woman   Patient ID: Michelle Logan, female    DOB: 04-08-1980, 38 y.o.   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  Her labs we checked them in August showed normal liver function tests and very low-grade viremia which would not meet criteria for treatment with an antiviral.  We will recheck her labs today.  She has noticed symptoms suggestive of hepatitis and no findings on exam to suggest it either.    Past Medical History:  Diagnosis Date  . Chronic hepatitis B without delta agent without hepatic coma ([place]) 12/21/2014  . Medical history non-contributory   . Pregnancy and infectious disease 12/21/2014  . Thyroid disease during pregnancy 12/21/2014  . UTI (urinary tract infection) 12/21/2014    Past Surgical History:  Procedure Laterality Date  . NO PAST SURGERIES      No family history on file.    Social History   Socioeconomic History  . Marital status: Married    Spouse name: Not on file  . Number of children: Not on file  . Years of education: Not on file  . Highest education level: Not on file  Occupational History  . Not on file  Social Needs  . Financial resource strain: Not on file  . Food insecurity    Worry: Never true    Inability: Never true  . Transportation needs    Medical: No    Non-medical: No  Tobacco Use  . Smoking status: Never Smoker  . Smokeless tobacco: Never Used  Substance and Sexual Activity  . Alcohol use: No    Alcohol/week: 0.0 standard drinks  . Drug use: No  . Sexual activity: Yes    Partners: Male    Birth control/protection: None  Lifestyle  . Physical activity    Days per week: Not on file    Minutes per session: Not on file  . Stress: Not on file  Relationships  . Social Herbalist on  phone: Not on file    Gets together: Not on file    Attends religious service: Not on file    Active member of club or organization: Not on file    Attends meetings of clubs or organizations: Not on file    Relationship status: Not on file  Other Topics Concern  . Not on file  Social History Narrative  . Not on file    No Known Allergies   Current Outpatient Medications:  .  acetaminophen (TYLENOL) 325 MG tablet, Take 650 mg by mouth every 6 (six) hours as needed., Disp: , Rfl:  .  aspirin EC 81 MG tablet, Take 1 tablet (81 mg total) by mouth daily. (Patient not taking: Reported on 10/12/2018), Disp: 30 tablet, Rfl: 3 .  Blood Pressure Monitoring (BLOOD PRESSURE KIT) DEVI, 1 Device by Does not apply route as needed. ICD 10:  O09.90, Disp: 1 Device, Rfl: 0 .  Prenat-Fe Poly-Methfol-FA-DHA (VITAFOL ULTRA) 29-0.6-0.4-200 MG CAPS, TAKE 1 CAPSULE BY MOUTH DAILY, Disp: 30 capsule, Rfl: 12 .  promethazine (PHENERGAN) 25 MG tablet, TAKE 1 TABLET(25 MG) BY MOUTH EVERY 6 HOURS AS NEEDED FOR NAUSEA OR VOMITING, Disp: 30 tablet, Rfl: 2   Review of Systems  Constitutional: Negative for chills and fever.  HENT: Negative for congestion and  sore throat.   Eyes: Negative for photophobia.  Respiratory: Negative for cough, shortness of breath and wheezing.   Cardiovascular: Negative for chest pain, palpitations and leg swelling.  Gastrointestinal: Negative for abdominal pain, blood in stool, constipation, diarrhea, nausea and vomiting.  Genitourinary: Negative for dysuria, flank pain and hematuria.  Musculoskeletal: Negative for back pain and myalgias.  Skin: Negative for rash.  Neurological: Negative for dizziness, weakness and headaches.  Hematological: Does not bruise/bleed easily.  Psychiatric/Behavioral: Negative for suicidal ideas.       Objective:   Physical Exam Constitutional:      General: She is not in acute distress.    Appearance: She is not diaphoretic.  HENT:     Head:  Normocephalic and atraumatic.     Right Ear: External ear normal.     Left Ear: External ear normal.     Nose: Nose normal.     Mouth/Throat:     Pharynx: No oropharyngeal exudate.  Eyes:     General: No scleral icterus.    Conjunctiva/sclera: Conjunctivae normal.     Pupils: Pupils are equal, round, and reactive to light.  Neck:     Musculoskeletal: Normal range of motion and neck supple.  Cardiovascular:     Rate and Rhythm: Normal rate and regular rhythm.     Heart sounds: Normal heart sounds.  Pulmonary:     Effort: Pulmonary effort is normal. No respiratory distress.     Breath sounds: Normal breath sounds. No wheezing.  Abdominal:     General: Bowel sounds are normal. There is no distension.     Palpations: Abdomen is soft.  Musculoskeletal: Normal range of motion.        General: No tenderness.  Lymphadenopathy:     Cervical: No cervical adenopathy.  Skin:    General: Skin is warm and dry.     Coloration: Skin is not pale.     Findings: No erythema or rash.  Neurological:     General: No focal deficit present.     Mental Status: She is alert and oriented to person, place, and time.     Coordination: Coordination normal.  Psychiatric:        Mood and Affect: Mood normal.        Behavior: Behavior normal.        Thought Content: Thought content normal.        Judgment: Judgment normal.           Assessment & Plan:   Chronic hepatitis b without delta agent:  ---check LFTS and HBV DNA --rtc in January

## 2018-11-05 ENCOUNTER — Ambulatory Visit: Payer: Medicaid Other | Admitting: Infectious Disease

## 2018-11-12 LAB — COMPLETE METABOLIC PANEL WITH GFR
AG Ratio: 1.6 (calc) (ref 1.0–2.5)
ALT: 6 U/L (ref 6–29)
AST: 12 U/L (ref 10–30)
Albumin: 3.6 g/dL (ref 3.6–5.1)
Alkaline phosphatase (APISO): 61 U/L (ref 31–125)
BUN: 10 mg/dL (ref 7–25)
CO2: 23 mmol/L (ref 20–32)
Calcium: 8.8 mg/dL (ref 8.6–10.2)
Chloride: 106 mmol/L (ref 98–110)
Creat: 0.68 mg/dL (ref 0.50–1.10)
GFR, Est African American: 130 mL/min/{1.73_m2} (ref >=60)
GFR, Est Non African American: 113 mL/min/{1.73_m2} (ref >=60)
Globulin: 2.3 g/dL (calc) (ref 1.9–3.7)
Glucose, Bld: 67 mg/dL (ref 65–99)
Potassium: 4.1 mmol/L (ref 3.5–5.3)
Sodium: 137 mmol/L (ref 135–146)
Total Bilirubin: 0.2 mg/dL (ref 0.2–1.2)
Total Protein: 5.9 g/dL — ABNORMAL LOW (ref 6.1–8.1)

## 2018-11-12 LAB — HEPATITIS B DNA, ULTRAQUANTITATIVE, PCR
Hepatitis B DNA (Calc): 3.3 Log IU/mL — ABNORMAL HIGH
Hepatitis B DNA: 1980 IU/mL — ABNORMAL HIGH

## 2018-11-23 ENCOUNTER — Other Ambulatory Visit (HOSPITAL_COMMUNITY): Payer: Self-pay | Admitting: *Deleted

## 2018-11-23 ENCOUNTER — Ambulatory Visit (HOSPITAL_COMMUNITY)
Admission: RE | Admit: 2018-11-23 | Discharge: 2018-11-23 | Disposition: A | Discharge status: Home / Self Care | Payer: Medicaid Other | Source: Ambulatory Visit | Attending: Obstetrics and Gynecology | Admitting: Obstetrics and Gynecology

## 2018-11-23 ENCOUNTER — Other Ambulatory Visit: Payer: Self-pay

## 2018-11-23 ENCOUNTER — Ambulatory Visit (INDEPENDENT_AMBULATORY_CARE_PROVIDER_SITE_OTHER): Payer: Medicaid Other | Admitting: Family Medicine

## 2018-11-23 ENCOUNTER — Ambulatory Visit (HOSPITAL_COMMUNITY): Payer: Medicaid Other | Admitting: *Deleted

## 2018-11-23 ENCOUNTER — Encounter (HOSPITAL_COMMUNITY): Payer: Self-pay

## 2018-11-23 VITALS — BP 108/61 | HR 83 | Wt 161.7 lb

## 2018-11-23 DIAGNOSIS — O09299 Supervision of pregnancy with other poor reproductive or obstetric history, unspecified trimester: Secondary | ICD-10-CM | POA: Insufficient documentation

## 2018-11-23 DIAGNOSIS — O0990 Supervision of high risk pregnancy, unspecified, unspecified trimester: Secondary | ICD-10-CM

## 2018-11-23 DIAGNOSIS — Z3A25 25 weeks gestation of pregnancy: Secondary | ICD-10-CM

## 2018-11-23 DIAGNOSIS — O09292 Supervision of pregnancy with other poor reproductive or obstetric history, second trimester: Secondary | ICD-10-CM

## 2018-11-23 DIAGNOSIS — E079 Disorder of thyroid, unspecified: Secondary | ICD-10-CM | POA: Diagnosis not present

## 2018-11-23 DIAGNOSIS — O99282 Endocrine, nutritional and metabolic diseases complicating pregnancy, second trimester: Secondary | ICD-10-CM | POA: Diagnosis not present

## 2018-11-23 DIAGNOSIS — Z362 Encounter for other antenatal screening follow-up: Secondary | ICD-10-CM

## 2018-11-23 DIAGNOSIS — O09522 Supervision of elderly multigravida, second trimester: Secondary | ICD-10-CM

## 2018-11-23 DIAGNOSIS — O99280 Endocrine, nutritional and metabolic diseases complicating pregnancy, unspecified trimester: Secondary | ICD-10-CM | POA: Insufficient documentation

## 2018-11-23 DIAGNOSIS — Z8632 Personal history of gestational diabetes: Secondary | ICD-10-CM | POA: Insufficient documentation

## 2018-11-23 DIAGNOSIS — B181 Chronic viral hepatitis B without delta-agent: Secondary | ICD-10-CM

## 2018-11-23 DIAGNOSIS — B1910 Unspecified viral hepatitis B without hepatic coma: Secondary | ICD-10-CM

## 2018-11-23 DIAGNOSIS — O0992 Supervision of high risk pregnancy, unspecified, second trimester: Secondary | ICD-10-CM

## 2018-11-23 DIAGNOSIS — O09529 Supervision of elderly multigravida, unspecified trimester: Secondary | ICD-10-CM

## 2018-11-23 DIAGNOSIS — O09523 Supervision of elderly multigravida, third trimester: Secondary | ICD-10-CM

## 2018-11-23 DIAGNOSIS — O98412 Viral hepatitis complicating pregnancy, second trimester: Secondary | ICD-10-CM

## 2018-11-23 NOTE — Progress Notes (Signed)
   PRENATAL VISIT NOTE  Subjective:  Michelle Logan is a 38 y.o. G3P2002 at [redacted]w[redacted]d being seen today for ongoing prenatal care.  She is currently monitored for the following issues for this high-risk pregnancy and has Chronic hepatitis B without delta agent without hepatic coma ([place]); Thyroid disease affecting pregnancy; Supervision of high risk pregnancy, antepartum; Advanced maternal age in multigravida; and History of gestational diabetes in prior pregnancy, currently pregnant on their problem list.  Patient reports no complaints.  Contractions: Not present. Vag. Bleeding: None.  Movement: Present. Denies leaking of fluid.   The following portions of the patient's history were reviewed and updated as appropriate: allergies, current medications, past family history, past medical history, past social history, past surgical history and problem list.   Objective:   Vitals:   11/23/18 1408  BP: 108/61  Pulse: 83  Weight: 161 lb 11.2 oz (73.3 kg)    Fetal Status: Fetal Heart Rate (bpm): 146 Fundal Height: 25 cm Movement: Present     General:  Alert, oriented and cooperative. Patient is in no acute distress.  Skin: Skin is warm and dry. No rash noted.   Cardiovascular: Normal heart rate noted  Respiratory: Normal respiratory effort, no problems with respiration noted  Abdomen: Soft, gravid, appropriate for gestational age.  Pain/Pressure: Absent     Pelvic: Cervical exam deferred        Extremities: Normal range of motion.  Edema: None  Mental Status: Normal mood and affect. Normal behavior. Normal judgment and thought content.   Assessment and Plan:  Pregnancy: G3P2002 at [redacted]w[redacted]d 1. Supervision of high risk pregnancy, antepartum FHT and FH normal Growth US done today - 15%tile.  2. History of gestational diabetes in prior pregnancy, currently pregnant 2hr GTT in next couple of weeks  3. Multigravida of advanced maternal age in third trimester Can't tolerate ASA 81mg .  4. Thyroid  disease affecting pregnancy Thyroid labs done on 10/5 were normal.   5. Chronic hepatitis B without delta agent without hepatic coma (HCC) Surveillance testing done on 10/28 - LFTs normal. Hep B stable  Preterm labor symptoms and general obstetric precautions including but not limited to vaginal bleeding, contractions, leaking of fluid and fetal movement were reviewed in detail with the patient. Please refer to After Visit Summary for other counseling recommendations.   Return in about 4 weeks (around 12/21/2018) for HR OB f/u.  Future Appointments  Date Time Provider [place]  12/28/2018 10:45 AM [place] MFC-US  12/28/2018 10:45 AM [place] 2 WH-MFCUS MFC-US  09/21/2019  9:45 AM Truman Hayward, MD [facility]ID-[facility]    Truett Mainland, DO

## 2018-12-02 ENCOUNTER — Other Ambulatory Visit: Payer: Self-pay | Admitting: *Deleted

## 2018-12-02 DIAGNOSIS — O0990 Supervision of high risk pregnancy, unspecified, unspecified trimester: Secondary | ICD-10-CM

## 2018-12-08 ENCOUNTER — Other Ambulatory Visit: Payer: Medicaid Other

## 2018-12-08 ENCOUNTER — Other Ambulatory Visit: Payer: Self-pay

## 2018-12-08 DIAGNOSIS — O0990 Supervision of high risk pregnancy, unspecified, unspecified trimester: Secondary | ICD-10-CM

## 2018-12-09 LAB — CBC
Hematocrit: 34.3 % (ref 34.0–46.6)
Hemoglobin: 11.1 g/dL (ref 11.1–15.9)
MCH: 27.5 pg (ref 26.6–33.0)
MCHC: 32.4 g/dL (ref 31.5–35.7)
MCV: 85 fL (ref 79–97)
Platelets: 171 10*3/uL (ref 150–450)
RBC: 4.04 x10E6/uL (ref 3.77–5.28)
RDW: 13.8 % (ref 11.7–15.4)
WBC: 8.9 10*3/uL (ref 3.4–10.8)

## 2018-12-09 LAB — RPR: RPR Ser Ql: NONREACTIVE

## 2018-12-09 LAB — HIV ANTIBODY (ROUTINE TESTING W REFLEX): HIV Screen 4th Generation wRfx: NONREACTIVE

## 2018-12-09 LAB — GLUCOSE TOLERANCE, 2 HOURS W/ 1HR
Glucose, 1 hour: 144 mg/dL (ref 65–179)
Glucose, 2 hour: 135 mg/dL (ref 65–152)
Glucose, Fasting: 69 mg/dL (ref 65–91)

## 2018-12-11 ENCOUNTER — Encounter: Payer: Medicaid Other | Admitting: Obstetrics and Gynecology

## 2018-12-14 ENCOUNTER — Other Ambulatory Visit: Payer: Self-pay

## 2018-12-14 ENCOUNTER — Telehealth (INDEPENDENT_AMBULATORY_CARE_PROVIDER_SITE_OTHER): Payer: Medicaid Other | Admitting: Obstetrics and Gynecology

## 2018-12-14 ENCOUNTER — Encounter: Payer: Self-pay | Admitting: Obstetrics and Gynecology

## 2018-12-14 VITALS — BP 103/56 | HR 90 | Wt 162.4 lb

## 2018-12-14 DIAGNOSIS — O0993 Supervision of high risk pregnancy, unspecified, third trimester: Secondary | ICD-10-CM

## 2018-12-14 DIAGNOSIS — E079 Disorder of thyroid, unspecified: Secondary | ICD-10-CM

## 2018-12-14 DIAGNOSIS — O09293 Supervision of pregnancy with other poor reproductive or obstetric history, third trimester: Secondary | ICD-10-CM

## 2018-12-14 DIAGNOSIS — B181 Chronic viral hepatitis B without delta-agent: Secondary | ICD-10-CM

## 2018-12-14 DIAGNOSIS — O98413 Viral hepatitis complicating pregnancy, third trimester: Secondary | ICD-10-CM

## 2018-12-14 DIAGNOSIS — Z8632 Personal history of gestational diabetes: Secondary | ICD-10-CM

## 2018-12-14 DIAGNOSIS — O99283 Endocrine, nutritional and metabolic diseases complicating pregnancy, third trimester: Secondary | ICD-10-CM

## 2018-12-14 DIAGNOSIS — O09523 Supervision of elderly multigravida, third trimester: Secondary | ICD-10-CM

## 2018-12-14 DIAGNOSIS — O0990 Supervision of high risk pregnancy, unspecified, unspecified trimester: Secondary | ICD-10-CM

## 2018-12-14 DIAGNOSIS — Z3A28 28 weeks gestation of pregnancy: Secondary | ICD-10-CM

## 2018-12-14 NOTE — Progress Notes (Signed)
8  TELEHEALTH OBSTETRICS PRENATAL VIRTUAL VIDEO VISIT ENCOUNTER NOTE  Provider location: [facility] [place]   I connected with Michelle Logan on 12/14/18 at  9:15 AM EST by MyChart Video Encounter at home and verified that I am speaking with the correct person using two identifiers.   I discussed the limitations, risks, security and privacy concerns of performing an evaluation and management service virtually and the availability of in person appointments. I also discussed with the patient that there may be a patient responsible charge related to this service. The patient expressed understanding and agreed to proceed. Subjective:  Michelle Logan is a 38 y.o. G3P2002 at [redacted]w[redacted]d being seen today for ongoing prenatal care.  She is currently monitored for the following issues for this high-risk pregnancy and has Chronic hepatitis B without delta agent without hepatic coma (HCC); Thyroid disease affecting pregnancy; Supervision of high risk pregnancy, antepartum; Advanced maternal age in multigravida; and History of gestational diabetes in prior pregnancy, currently pregnant on their problem list.  Patient reports no complaints.  Contractions: Not present. Vag. Bleeding: None.  Movement: Present. Denies any leaking of fluid.   The following portions of the patient's history were reviewed and updated as appropriate: allergies, current medications, past family history, past medical history, past social history, past surgical history and problem list.   Objective:   Vitals:   12/14/18 0940  BP: (!) 103/56  Pulse: 90  Weight: 162 lb 6.4 oz (73.7 kg)    Fetal Status:     Movement: Present     General:  Alert, oriented and cooperative. Patient is in no acute distress.  Respiratory: Normal respiratory effort, no problems with respiration noted  Mental Status: Normal mood and affect. Normal behavior. Normal judgment and thought content.  Rest of physical exam deferred due to  type of encounter  Imaging: [place] Mfm Ob Follow Up  Result Date: 11/23/2018 ----------------------------------------------------------------------  OBSTETRICS REPORT                       (Signed Final 11/23/2018 01:14 pm) ---------------------------------------------------------------------- Patient Info  ID #:       [id number]                          D.O.B.:  20-Jun-1980 (38 yrs)  Name:       Michelle Logan                Visit Date: 11/23/2018 11:22 am ---------------------------------------------------------------------- Performed By  Performed By:     Marcellina Millin          Ref. Address:     [address]                    RD[facility]                                                             [place]  [place] [address]  Attending:        Tama High MD        [facility]  Referred By:      Luna Glasgow CNM ---------------------------------------------------------------------- Orders   #  Description                          Code         Ordered By   1  [place] MFM OB FOLLOW UP                  [phone]     YU FANG  ----------------------------------------------------------------------   #  Order #                    Accession #                 Episode #   1  [id number]                  [id number]                  [id number]  ---------------------------------------------------------------------- Indications   Advanced maternal age multigravida 29+,        O33.522   second trimester   [redacted] weeks gestation of pregnancy                Z3A.25   Hepatitis B complicating pregnancy             O98.419, B19.10   Encounter for antenatal screening for          Z36.3   malformations (low risk NIPS)   Thyroid disease in pregnancy                   O99.280, E07.9   Poor obstetric history: Previous gestational   O09.299   diabetes   ---------------------------------------------------------------------- Vital Signs                                                 Height:        5'4" ---------------------------------------------------------------------- Fetal Evaluation  Num Of Fetuses:         1  Fetal Heart Rate(bpm):  161  Cardiac Activity:       Observed  Presentation:           Cephalic  Placenta:               Posterior  P. Cord Insertion:      Previously Visualized  Amniotic Fluid  AFI FV:      Within normal limits                              Largest Pocket(cm)  5.68 ---------------------------------------------------------------------- Biometry  BPD:      59.3  mm     G. Age:  24w 2d         17  %    CI:        71.84   %    70 - 86                                                          FL/HC:      19.6   %    18.7 - 20.3  HC:      222.7  mm     G. Age:  24w 2d         10  %    HC/AC:      1.14        1.04 - 1.22  AC:      195.7  mm     G. Age:  24w 2d         20  %    FL/BPD:     73.7   %    71 - 87  FL:       43.7  mm     G. Age:  24w 2d         18  %    FL/AC:      22.3   %    20 - 24  HUM:        40  mm     G. Age:  24w 2d         28  %  Est. FW:     681  gm      1 lb 8 oz     15  % ---------------------------------------------------------------------- OB History  Gravidity:    3         Term:   2 ---------------------------------------------------------------------- Gestational Age  LMP:           25w 0d        Date:  06/01/18                 EDD:   03/08/19  U/S Today:     24w 2d                                        EDD:   03/13/19  Best:          25w 0d     Det. By:  LMP  (06/01/18)          EDD:   03/08/19 ---------------------------------------------------------------------- Anatomy  Cranium:               Appears normal         LVOT:                   Appears normal  Cavum:                 Appears normal         Aortic Arch:            Appears normal  Ventricles:            Appears normal  Ductal Arch:            Appears normal  Choroid Plexus:        Appears normal         Diaphragm:              Appears normal  Cerebellum:            Appears normal         Stomach:                Appears normal, left                                                                        sided  Posterior Fossa:       Appears normal         Abdomen:                Appears normal  Nuchal Fold:           Previously seen        Abdominal Wall:         Previously seen  Face:                  Appears normal         Cord Vessels:           Previously seen                         (orbits and profile)  Lips:                  Appears normal         Kidneys:                Appear normal  Palate:                Previously seen        Bladder:                Appears normal  Thoracic:              Appears normal         Spine:                  Previously seen  Heart:                 Appears normal         Upper Extremities:      Previously seen                         (4CH, axis, and                         situs)  RVOT:                  Appears normal         Lower Extremities:      Previously seen  Other:  Heels and 5th digit previously visualized. Nasal bone previously          visualized. Fetus appears to be female. Technically difficult due to  fetal position. ---------------------------------------------------------------------- Cervix Uterus Adnexa  Cervix  Not visualized (advanced GA >24wks) ---------------------------------------------------------------------- Impression  Patient returned for completion of fetal anatomy.  Fetal biometry is consistent with her previously-established  dates (EFW at the 15th percentile). Amniotic fluid is normal  and good fetal activity is seen. Fetal anatomical survey was  completed and appears normal. ---------------------------------------------------------------------- Recommendations  -An appointment was made for her to return in 4 weeks for  fetal growth assessment.  ----------------------------------------------------------------------                  Noralee Space, MD Electronically Signed Final Report   11/23/2018 01:14 pm ----------------------------------------------------------------------   Assessment and Plan:  Pregnancy: Z6X0960 at [redacted]w[redacted]d 1. Supervision of high risk pregnancy, antepartum Stable Growth scan in 2 weeks  2. History of gestational diabetes in prior pregnancy, currently pregnant Normal 2 hr GTT  3. Multigravida of advanced maternal age in third trimester Unable to tolerated BASA Growth scan in 2 weeks  4. Chronic hepatitis B without delta agent without hepatic coma (HCC) LFT's normal last check Check q trimester  5. Thyroid disease affecting pregnancy Normal thyroid labs last check   Preterm labor symptoms and general obstetric precautions including but not limited to vaginal bleeding, contractions, leaking of fluid and fetal movement were reviewed in detail with the patient. I discussed the assessment and treatment plan with the patient. The patient was provided an opportunity to ask questions and all were answered. The patient agreed with the plan and demonstrated an understanding of the instructions. The patient was advised to call back or seek an in-person office evaluation/go to MAU at [facility] for any urgent or concerning symptoms. Please refer to After Visit Summary for other counseling recommendations.   I provided 8 minutes of face-to-face time during this encounter.  Return in about 2 weeks (around 12/28/2018) for OB visit, face to face.  Future Appointments  Date Time Provider Department Center  12/28/2018 10:45 AM WH-MFC NURSE WH-MFC MFC-US  12/28/2018 10:45 AM WH-MFC [place] 2 WH-MFCUS MFC-US  09/21/2019  9:45 AM Randall Hiss, MD RCID-RCID RCID    Hermina Staggers, MD Center for [facility], [facility]

## 2018-12-14 NOTE — Progress Notes (Signed)
I connected with  Michelle Logan on 12/14/18 at  9:15 AM EST by telephone and verified that I am speaking with the correct person using two identifiers.   I discussed the limitations, risks, security and privacy concerns of performing an evaluation and management service by telephone and the availability of in person appointments. I also discussed with the patient that there may be a patient responsible charge related to this service. The patient expressed understanding and agreed to proceed.  Bethanne Ginger, CMA 12/14/2018  9:38 AM

## 2018-12-28 ENCOUNTER — Ambulatory Visit (HOSPITAL_COMMUNITY)
Admission: RE | Admit: 2018-12-28 | Discharge: 2018-12-28 | Disposition: A | Discharge status: Home / Self Care | Payer: Medicaid Other | Source: Ambulatory Visit | Attending: Obstetrics and Gynecology | Admitting: Obstetrics and Gynecology

## 2018-12-28 ENCOUNTER — Other Ambulatory Visit (HOSPITAL_COMMUNITY): Payer: Self-pay | Admitting: *Deleted

## 2018-12-28 ENCOUNTER — Ambulatory Visit (INDEPENDENT_AMBULATORY_CARE_PROVIDER_SITE_OTHER): Payer: Medicaid Other | Admitting: Obstetrics and Gynecology

## 2018-12-28 ENCOUNTER — Ambulatory Visit (HOSPITAL_COMMUNITY): Payer: Medicaid Other | Admitting: *Deleted

## 2018-12-28 ENCOUNTER — Encounter (HOSPITAL_COMMUNITY): Payer: Self-pay

## 2018-12-28 ENCOUNTER — Other Ambulatory Visit: Payer: Self-pay

## 2018-12-28 VITALS — BP 100/61 | HR 101 | Wt 167.6 lb

## 2018-12-28 DIAGNOSIS — O09523 Supervision of elderly multigravida, third trimester: Secondary | ICD-10-CM | POA: Diagnosis present

## 2018-12-28 DIAGNOSIS — Z8632 Personal history of gestational diabetes: Secondary | ICD-10-CM

## 2018-12-28 DIAGNOSIS — O0990 Supervision of high risk pregnancy, unspecified, unspecified trimester: Secondary | ICD-10-CM

## 2018-12-28 DIAGNOSIS — Z362 Encounter for other antenatal screening follow-up: Secondary | ICD-10-CM

## 2018-12-28 DIAGNOSIS — B1910 Unspecified viral hepatitis B without hepatic coma: Secondary | ICD-10-CM | POA: Diagnosis not present

## 2018-12-28 DIAGNOSIS — O09529 Supervision of elderly multigravida, unspecified trimester: Secondary | ICD-10-CM | POA: Diagnosis present

## 2018-12-28 DIAGNOSIS — O99283 Endocrine, nutritional and metabolic diseases complicating pregnancy, third trimester: Secondary | ICD-10-CM

## 2018-12-28 DIAGNOSIS — Z23 Encounter for immunization: Secondary | ICD-10-CM

## 2018-12-28 DIAGNOSIS — E079 Disorder of thyroid, unspecified: Secondary | ICD-10-CM

## 2018-12-28 DIAGNOSIS — O98413 Viral hepatitis complicating pregnancy, third trimester: Secondary | ICD-10-CM | POA: Diagnosis not present

## 2018-12-28 DIAGNOSIS — B181 Chronic viral hepatitis B without delta-agent: Secondary | ICD-10-CM

## 2018-12-28 DIAGNOSIS — Z3A30 30 weeks gestation of pregnancy: Secondary | ICD-10-CM

## 2018-12-28 DIAGNOSIS — O09299 Supervision of pregnancy with other poor reproductive or obstetric history, unspecified trimester: Secondary | ICD-10-CM | POA: Diagnosis present

## 2018-12-28 DIAGNOSIS — O0993 Supervision of high risk pregnancy, unspecified, third trimester: Secondary | ICD-10-CM

## 2018-12-28 NOTE — Progress Notes (Signed)
Prenatal Visit Note Date: 12/28/2018 [facility]  Subjective:  Michelle Logan is a 38 y.o. G3P2002 at [redacted]w[redacted]d being seen today for ongoing prenatal care.  She is currently monitored for the following issues for this high-risk pregnancy and has Chronic hepatitis B without delta agent without hepatic coma ([place]); Thyroid disease affecting pregnancy; Supervision of high risk pregnancy, antepartum; Advanced maternal age in multigravida; and History of gestational diabetes in prior pregnancy, currently pregnant on their problem list.  Patient reports no complaints.   Contractions: Not present. Vag. Bleeding: None.  Movement: Present. Denies leaking of fluid.   The following portions of the patient's history were reviewed and updated as appropriate: allergies, current medications, past family history, past medical history, past social history, past surgical history and problem list. Problem list updated.  Objective:   Vitals:   12/28/18 1020  BP: 100/61  Pulse: (!) 101  Weight: 167 lb 9.6 oz (76 kg)    Fetal Status: Fetal Heart Rate (bpm): 132   Movement: Present     General:  Alert, oriented and cooperative. Patient is in no acute distress.  Skin: Skin is warm and dry. No rash noted.   Cardiovascular: Normal heart rate noted  Respiratory: Normal respiratory effort, no problems with respiration noted  Abdomen: Soft, gravid, appropriate for gestational age. Pain/Pressure: Absent     Pelvic:  Cervical exam deferred        Extremities: Normal range of motion.  Edema: None  Mental Status: Normal mood and affect. Normal behavior. Normal judgment and thought content.   Urinalysis:      Assessment and Plan:  Pregnancy: G3P2002 at [redacted]w[redacted]d  1. Supervision of high risk pregnancy, antepartum Routine care. BC options d/w her. She has medicaid. F/u w/ her nv Rpt growth u/s today - Tdap vaccine greater than or equal to 7yo IM  2. Multigravida of advanced maternal  age in third trimester No issues  3. Chronic hepatitis B without delta agent without hepatic coma (HCC) Told to make f/u visit with dr. Lucianne Lei damm per his note in October No current issues  Preterm labor symptoms and general obstetric precautions including but not limited to vaginal bleeding, contractions, leaking of fluid and fetal movement were reviewed in detail with the patient. Please refer to After Visit Summary for other counseling recommendations.  Return in about 2 weeks (around 01/11/2019) for in person, high risk.   Aletha Halim, MD

## 2019-01-06 ENCOUNTER — Telehealth: Payer: Self-pay | Admitting: Family Medicine

## 2019-01-06 NOTE — Telephone Encounter (Signed)
Called the patient to verify if she has a bp cuff due to the request to change the appointment to virtual if the patient has one per. Dr.Pratt. Left a voicemail informing please call our office back regarding access to a bp cuff.

## 2019-01-08 NOTE — L&D Delivery Note (Addendum)
Delivery Note At 5:45 AM a viable female was delivered via Vaginal, Spontaneous (Presentation: Right Occiput Anterior).  APGAR: 8, 9; weight PENDING .   Placenta status: Spontaneous, Intact.  Cord: 3 vessels with the following complications: None.  Cord pH: N/A  Anesthesia: None Episiotomy: None Lacerations: 1st degree vaginal  Suture Repair: none, hemodynamically stable Est. Blood Loss (mL): 25  Mom to postpartum.  Baby to Couplet care / Skin to Skin.   [name] 02/25/2019, 6:11 AM  Patient is a G3P3003 at [redacted]w[redacted]d who was admitted for IOL due to FGR, significant hx of chronic Hep B, hyperthyroid, AMA.  She progressed with augmentation via Pitocin/AROM.  I was gloved and present for delivery in its entirety.  Second stage of labor progressed, baby delivered after being complete approx 40 mins and laboring down prior to pushing.  Mild decels during second stage noted.  Complications: none  Lacerations: 1st deg vag, hemostatic, not repaired  EBL: 25cc  Paragard IUD inserted post placentally (see procedure note).  [name], CNM 7:49 AM  02/25/2019   Please schedule this patient for Postpartum visit in: 4 weeks with the following provider: Any provider Virtual (unless she needs IUD strings trimmed) For C/S patients schedule nurse incision check in weeks 2 weeks: no High risk pregnancy complicated by: FGR, chronic Hep B Delivery mode:  SVD Anticipated Birth Control:  PP IUD Placed PP Procedures needed: might need IUD string trimmed  Schedule Integrated BH visit: no

## 2019-01-14 ENCOUNTER — Telehealth (INDEPENDENT_AMBULATORY_CARE_PROVIDER_SITE_OTHER): Payer: Medicaid Other | Admitting: Obstetrics & Gynecology

## 2019-01-14 VITALS — BP 110/57 | HR 104

## 2019-01-14 DIAGNOSIS — O09293 Supervision of pregnancy with other poor reproductive or obstetric history, third trimester: Secondary | ICD-10-CM

## 2019-01-14 DIAGNOSIS — O0993 Supervision of high risk pregnancy, unspecified, third trimester: Secondary | ICD-10-CM

## 2019-01-14 DIAGNOSIS — Z3A32 32 weeks gestation of pregnancy: Secondary | ICD-10-CM | POA: Diagnosis not present

## 2019-01-14 DIAGNOSIS — O09299 Supervision of pregnancy with other poor reproductive or obstetric history, unspecified trimester: Secondary | ICD-10-CM

## 2019-01-14 DIAGNOSIS — O0990 Supervision of high risk pregnancy, unspecified, unspecified trimester: Secondary | ICD-10-CM

## 2019-01-14 NOTE — Patient Instructions (Signed)

## 2019-01-14 NOTE — Progress Notes (Signed)
   TELEHEALTH VIRTUAL OBSTETRICS VISIT ENCOUNTER NOTE  I connected with Michelle Logan on 01/14/19 at 10:35 AM EST by telephone at home and verified that I am speaking with the correct person using two identifiers.   I discussed the limitations, risks, security and privacy concerns of performing an evaluation and management service by telephone and the availability of in person appointments. I also discussed with the patient that there may be a patient responsible charge related to this service. The patient expressed understanding and agreed to proceed.  Subjective:  Michelle Logan is a 39 y.o. G3P2002 at [redacted]w[redacted]d being followed for ongoing prenatal care.  She is currently monitored for the following issues for this high-risk pregnancy and has Chronic hepatitis B without delta agent without hepatic coma (HCC); Thyroid disease affecting pregnancy; Supervision of high risk pregnancy, antepartum; Advanced maternal age in multigravida; and History of gestational diabetes in prior pregnancy, currently pregnant on their problem list.  Patient reports no complaints. Reports fetal movement. Denies any contractions, bleeding or leaking of fluid.   The following portions of the patient's history were reviewed and updated as appropriate: allergies, current medications, past family history, past medical history, past social history, past surgical history and problem list.   Objective:   General:  Alert, oriented and cooperative.   Mental Status: Normal mood and affect perceived. Normal judgment and thought content.  Rest of physical exam deferred due to type of encounter  Assessment and Plan:  Pregnancy: G3P2002 at [redacted]w[redacted]d 1. Supervision of high risk pregnancy, antepartum Doing well  2. History of gestational diabetes in prior pregnancy, currently pregnant Passed 2 hr GTT  Preterm labor symptoms and general obstetric precautions including but not limited to vaginal bleeding, contractions, leaking of  fluid and fetal movement were reviewed in detail with the patient.  I discussed the assessment and treatment plan with the patient. The patient was provided an opportunity to ask questions and all were answered. The patient agreed with the plan and demonstrated an understanding of the instructions. The patient was advised to call back or seek an in-person office evaluation/go to MAU at [facility] for any urgent or concerning symptoms. Please refer to After Visit Summary for other counseling recommendations.   I provided 10 minutes of non-face-to-face time during this encounter.  No follow-ups on file.  Future Appointments  Date Time Provider Department Center  01/25/2019 11:00 AM WH-MFC NURSE WH-MFC MFC-US  01/25/2019 11:00 AM WH-MFC [place] 5 WH-MFCUS MFC-US  01/29/2019  9:15 AM Conan Bowens, MD WOC-WOCA WOC  09/21/2019  9:45 AM Daiva Eves, Lisette Grinder, MD RCID-RCID RCID    Scheryl Darter, MD Center for [facility], [facility]

## 2019-01-14 NOTE — Progress Notes (Signed)
I connected with  Michelle Logan on 01/14/19 at 10:35 AM EST by telephone and verified that I am speaking with the correct person using two identifiers.   I discussed the limitations, risks, security and privacy concerns of performing an evaluation and management service by telephone and the availability of in person appointments. I also discussed with the patient that there may be a patient responsible charge related to this service. The patient expressed understanding and agreed to proceed.  Pt reports checking BP regularly; reports taking on 01/05/19: 92/54, HR 90.  Marjo Bicker, RN 01/14/2019  10:39 AM

## 2019-01-25 ENCOUNTER — Encounter (HOSPITAL_COMMUNITY): Payer: Self-pay | Admitting: *Deleted

## 2019-01-25 ENCOUNTER — Ambulatory Visit (HOSPITAL_COMMUNITY): Payer: Medicaid Other | Admitting: *Deleted

## 2019-01-25 ENCOUNTER — Other Ambulatory Visit (HOSPITAL_COMMUNITY): Payer: Self-pay | Admitting: *Deleted

## 2019-01-25 ENCOUNTER — Other Ambulatory Visit: Payer: Self-pay

## 2019-01-25 ENCOUNTER — Ambulatory Visit (HOSPITAL_COMMUNITY)
Admission: RE | Admit: 2019-01-25 | Discharge: 2019-01-25 | Disposition: A | Discharge status: Home / Self Care | Payer: Medicaid Other | Source: Ambulatory Visit | Attending: Obstetrics | Admitting: Obstetrics

## 2019-01-25 DIAGNOSIS — O09299 Supervision of pregnancy with other poor reproductive or obstetric history, unspecified trimester: Secondary | ICD-10-CM | POA: Diagnosis present

## 2019-01-25 DIAGNOSIS — Z8632 Personal history of gestational diabetes: Secondary | ICD-10-CM | POA: Insufficient documentation

## 2019-01-25 DIAGNOSIS — O09523 Supervision of elderly multigravida, third trimester: Secondary | ICD-10-CM | POA: Insufficient documentation

## 2019-01-25 DIAGNOSIS — B1910 Unspecified viral hepatitis B without hepatic coma: Secondary | ICD-10-CM

## 2019-01-25 DIAGNOSIS — Z362 Encounter for other antenatal screening follow-up: Secondary | ICD-10-CM | POA: Diagnosis not present

## 2019-01-25 DIAGNOSIS — O99283 Endocrine, nutritional and metabolic diseases complicating pregnancy, third trimester: Secondary | ICD-10-CM

## 2019-01-25 DIAGNOSIS — Z3A34 34 weeks gestation of pregnancy: Secondary | ICD-10-CM

## 2019-01-25 DIAGNOSIS — O0990 Supervision of high risk pregnancy, unspecified, unspecified trimester: Secondary | ICD-10-CM | POA: Diagnosis present

## 2019-01-25 DIAGNOSIS — O98413 Viral hepatitis complicating pregnancy, third trimester: Secondary | ICD-10-CM | POA: Diagnosis not present

## 2019-01-25 DIAGNOSIS — E079 Disorder of thyroid, unspecified: Secondary | ICD-10-CM

## 2019-01-25 DIAGNOSIS — O09293 Supervision of pregnancy with other poor reproductive or obstetric history, third trimester: Secondary | ICD-10-CM

## 2019-01-25 DIAGNOSIS — Z3689 Encounter for other specified antenatal screening: Secondary | ICD-10-CM

## 2019-01-29 ENCOUNTER — Other Ambulatory Visit: Payer: Self-pay

## 2019-01-29 ENCOUNTER — Encounter: Payer: Self-pay | Admitting: Obstetrics and Gynecology

## 2019-01-29 ENCOUNTER — Telehealth (INDEPENDENT_AMBULATORY_CARE_PROVIDER_SITE_OTHER): Payer: Medicaid Other | Admitting: Obstetrics and Gynecology

## 2019-01-29 VITALS — BP 100/59 | HR 94

## 2019-01-29 DIAGNOSIS — O09299 Supervision of pregnancy with other poor reproductive or obstetric history, unspecified trimester: Secondary | ICD-10-CM

## 2019-01-29 DIAGNOSIS — Z8632 Personal history of gestational diabetes: Secondary | ICD-10-CM

## 2019-01-29 DIAGNOSIS — B181 Chronic viral hepatitis B without delta-agent: Secondary | ICD-10-CM

## 2019-01-29 DIAGNOSIS — O99280 Endocrine, nutritional and metabolic diseases complicating pregnancy, unspecified trimester: Secondary | ICD-10-CM

## 2019-01-29 DIAGNOSIS — O99283 Endocrine, nutritional and metabolic diseases complicating pregnancy, third trimester: Secondary | ICD-10-CM

## 2019-01-29 DIAGNOSIS — E079 Disorder of thyroid, unspecified: Secondary | ICD-10-CM

## 2019-01-29 DIAGNOSIS — O09293 Supervision of pregnancy with other poor reproductive or obstetric history, third trimester: Secondary | ICD-10-CM

## 2019-01-29 DIAGNOSIS — O0990 Supervision of high risk pregnancy, unspecified, unspecified trimester: Secondary | ICD-10-CM

## 2019-01-29 DIAGNOSIS — Z3A34 34 weeks gestation of pregnancy: Secondary | ICD-10-CM

## 2019-01-29 DIAGNOSIS — O09523 Supervision of elderly multigravida, third trimester: Secondary | ICD-10-CM

## 2019-01-29 DIAGNOSIS — O0993 Supervision of high risk pregnancy, unspecified, third trimester: Secondary | ICD-10-CM

## 2019-01-29 NOTE — Progress Notes (Signed)
   TELEHEALTH OBSTETRICS PRENATAL VIRTUAL VIDEO VISIT ENCOUNTER NOTE  Provider location: Center for [facility] [place]   I connected with Michelle Logan on 01/29/19 at  9:15 AM EST by MyChart Video Encounter at home and verified that I am speaking with the correct person using two identifiers.   I discussed the limitations, risks, security and privacy concerns of performing an evaluation and management service virtually and the availability of in person appointments. I also discussed with the patient that there may be a patient responsible charge related to this service. The patient expressed understanding and agreed to proceed. Subjective:  Michelle Logan is a 39 y.o. G3P2002 at [redacted]w[redacted]d being seen today for ongoing prenatal care.  She is currently monitored for the following issues for this high-risk pregnancy and has Chronic hepatitis B without delta agent without hepatic coma (HCC); Thyroid disease affecting pregnancy; Supervision of high risk pregnancy, antepartum; Advanced maternal age in multigravida; and History of gestational diabetes in prior pregnancy, currently pregnant on their problem list.  Patient reports no complaints.  Contractions: Not present. Vag. Bleeding: None.  Movement: Present. Denies any leaking of fluid.   The following portions of the patient's history were reviewed and updated as appropriate: allergies, current medications, past family history, past medical history, past social history, past surgical history and problem list.   Objective:   Vitals:   01/29/19 0912  BP: (!) 100/59  Pulse: 94    Fetal Status:     Movement: Present     General:  Alert, oriented and cooperative. Patient is in no acute distress.  Respiratory: Normal respiratory effort, no problems with respiration noted  Mental Status: Normal mood and affect. Normal behavior. Normal judgment and thought content.  Rest of physical exam deferred due to type of  encounter  Imaging:   Assessment and Plan:  Pregnancy: G3P2002 at [redacted]w[redacted]d  1. Supervision of high risk pregnancy, antepartum Last growth 12%tile, normal dopplers Has return appt - thinking about IUD, sent info via mychart  2. Multigravida of advanced maternal age in third trimester  3. History of gestational diabetes in prior pregnancy, currently pregnant  4. Chronic hepatitis B without delta agent without hepatic coma (HCC) Stable no meds  5. Thyroid disease affecting pregnancy Will draw TSH/T3/T4 next visit   Preterm labor symptoms and general obstetric precautions including but not limited to vaginal bleeding, contractions, leaking of fluid and fetal movement were reviewed in detail with the patient. I discussed the assessment and treatment plan with the patient. The patient was provided an opportunity to ask questions and all were answered. The patient agreed with the plan and demonstrated an understanding of the instructions. The patient was advised to call back or seek an in-person office evaluation/go to [facility] at [facility] for any urgent or concerning symptoms. Please refer to After Visit Summary for other counseling recommendations.   I provided 15 minutes of face-to-face time during this encounter.  Return in about 2 weeks (around 02/12/2019) for high OB, in person, draw TSH.  Future Appointments  Date Time Provider Department Center  02/15/2019 11:00 AM [facility] NURSE [facility]  02/15/2019 11:00 AM [facility] [place] 3 [facility]  09/21/2019  9:45 AM Randall Hiss, MD [facility]    Conan Bowens, MD Center for [facility], [facility]

## 2019-01-29 NOTE — Progress Notes (Signed)
I connected with  Michelle Logan on 01/29/19 at 0909 by telephone and verified that I am speaking with the correct person using two identifiers.   I discussed the limitations, risks, security and privacy concerns of performing an evaluation and management service by telephone and the availability of in person appointments. I also discussed with the patient that there may be a patient responsible charge related to this service. The patient expressed understanding and agreed to proceed.  Marjo Bicker, RN 01/29/2019  9:09 AM

## 2019-02-07 ENCOUNTER — Other Ambulatory Visit: Payer: Self-pay | Admitting: Advanced Practice Midwife

## 2019-02-07 DIAGNOSIS — O0990 Supervision of high risk pregnancy, unspecified, unspecified trimester: Secondary | ICD-10-CM

## 2019-02-15 ENCOUNTER — Ambulatory Visit (HOSPITAL_COMMUNITY)
Admission: RE | Admit: 2019-02-15 | Discharge: 2019-02-15 | Disposition: A | Discharge status: Home / Self Care | Payer: Medicaid Other | Source: Ambulatory Visit | Attending: Obstetrics and Gynecology | Admitting: Obstetrics and Gynecology

## 2019-02-15 ENCOUNTER — Other Ambulatory Visit: Payer: Self-pay

## 2019-02-15 ENCOUNTER — Encounter (HOSPITAL_COMMUNITY): Payer: Self-pay

## 2019-02-15 ENCOUNTER — Other Ambulatory Visit (HOSPITAL_COMMUNITY): Payer: Self-pay | Admitting: Obstetrics and Gynecology

## 2019-02-15 ENCOUNTER — Ambulatory Visit (HOSPITAL_COMMUNITY): Payer: Medicaid Other | Admitting: *Deleted

## 2019-02-15 ENCOUNTER — Other Ambulatory Visit (HOSPITAL_COMMUNITY): Payer: Self-pay | Admitting: *Deleted

## 2019-02-15 DIAGNOSIS — O0990 Supervision of high risk pregnancy, unspecified, unspecified trimester: Secondary | ICD-10-CM | POA: Insufficient documentation

## 2019-02-15 DIAGNOSIS — Z8632 Personal history of gestational diabetes: Secondary | ICD-10-CM | POA: Insufficient documentation

## 2019-02-15 DIAGNOSIS — B1910 Unspecified viral hepatitis B without hepatic coma: Secondary | ICD-10-CM

## 2019-02-15 DIAGNOSIS — O09523 Supervision of elderly multigravida, third trimester: Secondary | ICD-10-CM

## 2019-02-15 DIAGNOSIS — E079 Disorder of thyroid, unspecified: Secondary | ICD-10-CM

## 2019-02-15 DIAGNOSIS — Z3689 Encounter for other specified antenatal screening: Secondary | ICD-10-CM | POA: Diagnosis present

## 2019-02-15 DIAGNOSIS — O365930 Maternal care for other known or suspected poor fetal growth, third trimester, not applicable or unspecified: Secondary | ICD-10-CM

## 2019-02-15 DIAGNOSIS — O98413 Viral hepatitis complicating pregnancy, third trimester: Secondary | ICD-10-CM

## 2019-02-15 DIAGNOSIS — Z3A37 37 weeks gestation of pregnancy: Secondary | ICD-10-CM

## 2019-02-15 DIAGNOSIS — Z362 Encounter for other antenatal screening follow-up: Secondary | ICD-10-CM

## 2019-02-15 DIAGNOSIS — O09299 Supervision of pregnancy with other poor reproductive or obstetric history, unspecified trimester: Secondary | ICD-10-CM | POA: Insufficient documentation

## 2019-02-15 DIAGNOSIS — O99283 Endocrine, nutritional and metabolic diseases complicating pregnancy, third trimester: Secondary | ICD-10-CM

## 2019-02-19 ENCOUNTER — Other Ambulatory Visit: Payer: Self-pay

## 2019-02-19 ENCOUNTER — Other Ambulatory Visit (HOSPITAL_COMMUNITY)
Admission: RE | Admit: 2019-02-19 | Discharge: 2019-02-19 | Disposition: A | Discharge status: Home / Self Care | Payer: Medicaid Other | Source: Ambulatory Visit | Attending: Obstetrics & Gynecology | Admitting: Obstetrics & Gynecology

## 2019-02-19 ENCOUNTER — Encounter: Payer: Self-pay | Admitting: Obstetrics & Gynecology

## 2019-02-19 ENCOUNTER — Ambulatory Visit (INDEPENDENT_AMBULATORY_CARE_PROVIDER_SITE_OTHER): Payer: Medicaid Other | Admitting: Obstetrics & Gynecology

## 2019-02-19 ENCOUNTER — Other Ambulatory Visit: Payer: Self-pay | Admitting: Advanced Practice Midwife

## 2019-02-19 ENCOUNTER — Encounter (HOSPITAL_COMMUNITY): Payer: Self-pay | Admitting: *Deleted

## 2019-02-19 ENCOUNTER — Telehealth (HOSPITAL_COMMUNITY): Payer: Self-pay | Admitting: *Deleted

## 2019-02-19 VITALS — BP 103/65 | HR 133 | Temp 98.2°F | Wt 168.6 lb

## 2019-02-19 DIAGNOSIS — O0993 Supervision of high risk pregnancy, unspecified, third trimester: Secondary | ICD-10-CM

## 2019-02-19 DIAGNOSIS — O99283 Endocrine, nutritional and metabolic diseases complicating pregnancy, third trimester: Secondary | ICD-10-CM

## 2019-02-19 DIAGNOSIS — O0990 Supervision of high risk pregnancy, unspecified, unspecified trimester: Secondary | ICD-10-CM | POA: Insufficient documentation

## 2019-02-19 DIAGNOSIS — E079 Disorder of thyroid, unspecified: Secondary | ICD-10-CM

## 2019-02-19 DIAGNOSIS — Z3A37 37 weeks gestation of pregnancy: Secondary | ICD-10-CM

## 2019-02-19 DIAGNOSIS — B181 Chronic viral hepatitis B without delta-agent: Secondary | ICD-10-CM

## 2019-02-19 DIAGNOSIS — O365930 Maternal care for other known or suspected poor fetal growth, third trimester, not applicable or unspecified: Secondary | ICD-10-CM | POA: Insufficient documentation

## 2019-02-19 NOTE — Telephone Encounter (Signed)
Preadmission screen  

## 2019-02-19 NOTE — Progress Notes (Signed)
Pt had [place] for growth/BPP and cord doppler on 2/8.  BPP & cord doppler scheduled on 2/15

## 2019-02-19 NOTE — Addendum Note (Signed)
Addended by: Jaynie Collins A on: 02/19/2019 12:27 PM   Modules accepted: Orders, SmartSet

## 2019-02-19 NOTE — Progress Notes (Signed)
PRENATAL VISIT NOTE  Subjective:  Michelle Logan is a 39 y.o. G3P2002 at [redacted]w[redacted]d being seen today for ongoing prenatal care.  She is currently monitored for the following issues for this high-risk pregnancy and has Chronic hepatitis B without delta agent without hepatic coma (HCC); Thyroid disease affecting pregnancy; Supervision of high risk pregnancy, antepartum; Advanced maternal age in multigravida; History of gestational diabetes in prior pregnancy, currently pregnant; and IUGR (intrauterine growth restriction) affecting care of mother, third trimester, not applicable or unspecified fetus on their problem list.  Patient reports no complaints.  Contractions: Not present. Vag. Bleeding: None.  Movement: Present. Denies leaking of fluid.   The following portions of the patient's history were reviewed and updated as appropriate: allergies, current medications, past family history, past medical history, past social history, past surgical history and problem list.   Objective:   Vitals:   02/19/19 1003  BP: 103/65  Pulse: (!) 133  Temp: 98.2 F (36.8 C)  Weight: 168 lb 9.6 oz (76.5 kg)    Fetal Status: Fetal Heart Rate (bpm): 154   Movement: Present  Presentation: Vertex  General:  Alert, oriented and cooperative. Patient is in no acute distress.  Skin: Skin is warm and dry. No rash noted.   Cardiovascular: Normal heart rate noted  Respiratory: Normal respiratory effort, no problems with respiration noted  Abdomen: Soft, gravid, appropriate for gestational age.  Pain/Pressure: Absent     Pelvic: Cervical exam performed Dilation: 1.5 Effacement (%): 50 Station: -3  Extremities: Normal range of motion.  Edema: None  Mental Status: Normal mood and affect. Normal behavior. Normal judgment and thought content.   Imaging: [place] MFM FETAL BPP WO NON STRESS  Result Date: 02/15/2019 ----------------------------------------------------------------------  OBSTETRICS REPORT                        (Signed Final 02/15/2019 02:01 pm) ---------------------------------------------------------------------- Patient Info  ID #:       [id number]                          D.O.B.:  [birth year] (38 yrs)  Name:       Michelle Logan                Visit Date: 02/15/2019 11:09 am ---------------------------------------------------------------------- Performed By  Performed By:     Lenise Arena        Ref. Address:     [address]                                                             [place]                                                             [place] [address]  Attending:        Ma Rings MD         [facility]  Referred By:      Jorje Guild CNM ---------------------------------------------------------------------- Orders   #  Description                          Code         Ordered By   1  [place] MFM OB FOLLOW UP                  76816.01     RAVI SHANKAR   2  [place] MFM FETAL BPP WO NON              76819.01     RAVI SHANKAR      STRESS   3  [place] MFM UA CORD DOPPLER               76820.02     RAVI [facility]  ----------------------------------------------------------------------   #  Order #                    Accession #                 Episode #   1  [id number]                  [id number]                  [id number]   2  [id number]                  [id number]                  [id number]   3  [id number]                  [id number]                  [id number]  ---------------------------------------------------------------------- Indications   Maternal care for known or suspected poor      O36.5930   fetal growth, third trimester, not applicable or   unspecified IUGR   Encounter for other antenatal screening        Z36.2   follow-up   Advanced maternal age multigravida 56+,        O85.523   third trimester   Hepatitis B complicating pregnancy             O98.419, B19.10    Thyroid disease in pregnancy                   O99.280, E07.9   (hyperthyroidism)   Poor obstetric history: Previous gestational   O09.299   diabetes   [redacted] weeks gestation of pregnancy                Z3A.37  ---------------------------------------------------------------------- Vital Signs  Weight (lb): 167  Height:        5'4"  BMI:         28.66 ---------------------------------------------------------------------- Fetal Evaluation  Num Of Fetuses:         1  Fetal Heart Rate(bpm):  150  Cardiac Activity:       Observed  Presentation:           Cephalic  Placenta:               Posterior  P. Cord Insertion:      Previously Visualized  Amniotic Fluid  AFI FV:      Within normal limits  AFI Sum(cm)     %Tile       Largest Pocket(cm)  10.45           27          3.9  RUQ(cm)       RLQ(cm)       LUQ(cm)        LLQ(cm)  3.55          1.78          1.22           3.9 ---------------------------------------------------------------------- Biophysical Evaluation  Amniotic F.V:   Within normal limits       F. Tone:        Observed  F. Movement:    Observed                   Score:          8/8  F. Breathing:   Observed ---------------------------------------------------------------------- Biometry  BPD:      87.1  mm     G. Age:  35w 1d         17  %    CI:        74.22   %    70 - 86                                                          FL/HC:      21.0   %    20.8 - 22.6  HC:       321   mm     G. Age:  36w 2d         11  %    HC/AC:      1.05        0.92 - 1.05  AC:      304.9  mm     G. Age:  34w 3d          6  %    FL/BPD:     77.5   %    71 - 87  FL:       67.5  mm     G. Age:  34w 5d          5  %    FL/AC:      22.1   %    20 - 24  LV:        3.5  mm  Est. FW:    2511  gm      5 lb 9 oz      9  % ---------------------------------------------------------------------- OB History  Gravidity:    3  Term:   2 ----------------------------------------------------------------------  Gestational Age  LMP:           37w 0d        Date:  06/01/18                 EDD:   03/08/19  U/S Today:     35w 1d                                        EDD:   03/21/19  Best:          37w 0d     Det. By:  LMP  (06/01/18)          EDD:   03/08/19 ---------------------------------------------------------------------- Anatomy  Cranium:               Appears normal         LVOT:                   Previously seen  Cavum:                 Appears normal         Aortic Arch:            Previously seen  Ventricles:            Appears normal         Ductal Arch:            Previously seen  Choroid Plexus:        Previously seen        Diaphragm:              Appears normal  Cerebellum:            Previously seen        Stomach:                Appears normal, left                                                                        sided  Posterior Fossa:       Previously seen        Abdomen:                Appears normal  Nuchal Fold:           Previously seen        Abdominal Wall:         Previously seen  Face:                  Orbits and profile     Cord Vessels:           Previously seen                         previously seen  Lips:                  Appears normal         Kidneys:                Appear  normal  Palate:                Previously seen        Bladder:                Appears normal  Thoracic:              Appears normal         Spine:                  Previously seen  Heart:                 Appears normal         Upper Extremities:      Previously seen                         (4CH, axis, and                         situs)  RVOT:                  Previously seen        Lower Extremities:      Previously seen  Other:  Heels and 5th digit previously visualized. Nasal bone previously          visualized. Fetus appears to be female. Technically difficult due to          fetal position. ---------------------------------------------------------------------- Doppler - Fetal Vessels  Umbilical Artery   S/D      %tile                                            ADFV    RDFV  2.43       56                                                No      No ---------------------------------------------------------------------- Cervix Uterus Adnexa  Cervix  Not visualized (advanced GA >24wks) ---------------------------------------------------------------------- Comments  This patient was seen for a follow up growth scan due to a  history of hyperthyroidism.  The overall EFW has measured  in the lower normal ranges during her prior ultrasound exams.  She denies any problems since her last exam and reports  feeling vigorous fetal movements throughout the day.  On today's exam, the EFW measures at the 9th percentile for  her gestational age indicating fetal growth restriction.  There  was normal amniotic fluid noted.  A biophysical profile performed today due to fetal growth  restriction was 8 out of 8.  Doppler studies of the umbilical arteries showed a normal  S/D ratio of 2.43. There were no signs of absent or reversed  end-diastolic flow.  Due to fetal growth restriction, we will continue to follow her  with weekly fetal testing and umbilical artery Doppler studies.  Delivery should probably occur at between 38 to 39 weeks  (next week). ----------------------------------------------------------------------                   Ma Rings, MD Electronically Signed Final Report   02/15/2019 02:01 pm ----------------------------------------------------------------------  [place] MFM OB FOLLOW UP  Result Date: 02/15/2019 ----------------------------------------------------------------------  OBSTETRICS REPORT                       (Signed Final 02/15/2019 02:01 pm) ---------------------------------------------------------------------- Patient Info  ID #:       [id number]                          D.O.B.:  29-May-1980 (38 yrs)  Name:       Michelle Logan                Visit Date: 02/15/2019 11:09 am  ---------------------------------------------------------------------- Performed By  Performed By:     Novella Rob        Ref. Address:     [address]                    [place]                                                             [place] [address]  Attending:        Johnell Comings MD         [facility]  Referred By:      Luna Glasgow CNM ---------------------------------------------------------------------- Orders   #  Description                          Code         Ordered By   1  [place] MFM OB FOLLOW UP                  62130.86     RAVI SHANKAR   2  [place] MFM FETAL BPP WO NON              76819.01     RAVI SHANKAR      STRESS   3  [place] MFM UA CORD DOPPLER               57846.96     RAVI [facility]  ----------------------------------------------------------------------   #  Order #                    Accession #                 Episode #   1  [id number]  [id number]                  [id number]   2  [id number]                  [id number]                  [id number]   3  [id number]                  [id number]                  [id number]  ---------------------------------------------------------------------- Indications   Maternal care for known or suspected poor      O36.5930   fetal growth, third trimester, not applicable or   unspecified IUGR   Encounter for other antenatal screening        Z36.2   follow-up   Advanced maternal age multigravida 35+,        O22.523   third trimester   Hepatitis B complicating pregnancy             O98.419, B19.10   Thyroid disease in pregnancy                   O99.280, E07.9   (hyperthyroidism)   Poor obstetric history: Previous gestational   O09.299   diabetes   [redacted] weeks gestation of pregnancy                Z3A.37   ---------------------------------------------------------------------- Vital Signs  Weight (lb): 167                               Height:        5'4"  BMI:         28.66 ---------------------------------------------------------------------- Fetal Evaluation  Num Of Fetuses:         1  Fetal Heart Rate(bpm):  150  Cardiac Activity:       Observed  Presentation:           Cephalic  Placenta:               Posterior  P. Cord Insertion:      Previously Visualized  Amniotic Fluid  AFI FV:      Within normal limits  AFI Sum(cm)     %Tile       Largest Pocket(cm)  10.45           27          3.9  RUQ(cm)       RLQ(cm)       LUQ(cm)        LLQ(cm)  3.55          1.78          1.22           3.9 ---------------------------------------------------------------------- Biophysical Evaluation  Amniotic F.V:   Within normal limits       F. Tone:        Observed  F. Movement:    Observed                   Score:          8/8  F. Breathing:   Observed ---------------------------------------------------------------------- Biometry  BPD:      87.1  mm     G. Age:  35w 1d         17  %  CI:        74.22   %    70 - 86                                                          FL/HC:      21.0   %    20.8 - 22.6  HC:       321   mm     G. Age:  36w 2d         11  %    HC/AC:      1.05        0.92 - 1.05  AC:      304.9  mm     G. Age:  34w 3d          6  %    FL/BPD:     77.5   %    71 - 87  FL:       67.5  mm     G. Age:  34w 5d          5  %    FL/AC:      22.1   %    20 - 24  LV:        3.5  mm  Est. FW:    2511  gm      5 lb 9 oz      9  % ---------------------------------------------------------------------- OB History  Gravidity:    3         Term:   2 ---------------------------------------------------------------------- Gestational Age  LMP:           37w 0d        Date:  06/01/18                 EDD:   03/08/19  U/S Today:     35w 1d                                        EDD:   03/21/19  Best:          37w 0d     Det. By:   LMP  (06/01/18)          EDD:   03/08/19 ---------------------------------------------------------------------- Anatomy  Cranium:               Appears normal         LVOT:                   Previously seen  Cavum:                 Appears normal         Aortic Arch:            Previously seen  Ventricles:            Appears normal         Ductal Arch:            Previously seen  Choroid Plexus:        Previously seen        Diaphragm:  Appears normal  Cerebellum:            Previously seen        Stomach:                Appears normal, left                                                                        sided  Posterior Fossa:       Previously seen        Abdomen:                Appears normal  Nuchal Fold:           Previously seen        Abdominal Wall:         Previously seen  Face:                  Orbits and profile     Cord Vessels:           Previously seen                         previously seen  Lips:                  Appears normal         Kidneys:                Appear normal  Palate:                Previously seen        Bladder:                Appears normal  Thoracic:              Appears normal         Spine:                  Previously seen  Heart:                 Appears normal         Upper Extremities:      Previously seen                         (4CH, axis, and                         situs)  RVOT:                  Previously seen        Lower Extremities:      Previously seen  Other:  Heels and 5th digit previously visualized. Nasal bone previously          visualized. Fetus appears to be female. Technically difficult due to          fetal position. ---------------------------------------------------------------------- Doppler - Fetal Vessels  Umbilical Artery   S/D     %tile  ADFV    RDFV  2.43       56                                                No      No ----------------------------------------------------------------------  Cervix Uterus Adnexa  Cervix  Not visualized (advanced GA >24wks) ---------------------------------------------------------------------- Comments  This patient was seen for a follow up growth scan due to a  history of hyperthyroidism.  The overall EFW has measured  in the lower normal ranges during her prior ultrasound exams.  She denies any problems since her last exam and reports  feeling vigorous fetal movements throughout the day.  On today's exam, the EFW measures at the 9th percentile for  her gestational age indicating fetal growth restriction.  There  was normal amniotic fluid noted.  A biophysical profile performed today due to fetal growth  restriction was 8 out of 8.  Doppler studies of the umbilical arteries showed a normal  S/D ratio of 2.43. There were no signs of absent or reversed  end-diastolic flow.  Due to fetal growth restriction, we will continue to follow her  with weekly fetal testing and umbilical artery Doppler studies.  Delivery should probably occur at between 38 to 39 weeks  (next week). ----------------------------------------------------------------------                   Ma Rings, MD Electronically Signed Final Report   02/15/2019 02:01 pm ----------------------------------------------------------------------  [place] MFM OB FOLLOW UP  Result Date: 01/25/2019 ----------------------------------------------------------------------  OBSTETRICS REPORT                       (Signed Final 01/25/2019 12:13 pm) ---------------------------------------------------------------------- Patient Info  ID #:       [id number]                          D.O.B.:  1980/08/04 (38 yrs)  Name:       Michelle Logan                Visit Date: 01/25/2019 11:11 am ---------------------------------------------------------------------- Performed By  Performed By:     Hurman Horn          Ref. Address:     [address]                                                             [place]                                                             [place] [address]  Attending:        Noralee Space MD        [facility]  Referred By:      Jorje Guild CNM ---------------------------------------------------------------------- Orders   #  Description                          Code         Ordered By   1  [place] MFM OB FOLLOW UP                  14481.85     Rosana Hoes  ----------------------------------------------------------------------   #  Order #                    Accession #                 Episode #   1  [id number]                  [id number]                  [id number]  ---------------------------------------------------------------------- Indications   Encounter for other antenatal screening        Z36.2   follow-up   [redacted] weeks gestation of pregnancy                Z3A.26   Advanced maternal age multigravida 46+,        O12.523   third trimester   Hepatitis B complicating pregnancy             O98.419, B19.10   Thyroid disease in pregnancy                   O99.280, E07.9   (hyperthyroidism)   Poor obstetric history: Previous gestational   O09.299   diabetes  ---------------------------------------------------------------------- Vital Signs                                                 Height:        5'4" ---------------------------------------------------------------------- Fetal Evaluation  Num Of Fetuses:         1  Fetal Heart Rate(bpm):  145  Cardiac Activity:       Observed  Presentation:           Cephalic  Placenta:               Posterior  Amniotic Fluid  AFI FV:      Within normal limits  AFI Sum(cm)     %Tile       Largest Pocket(cm)  8.29            6           4.97  RUQ(cm)                                    LLQ(cm)  4.97                                       3.32 ---------------------------------------------------------------------- Biophysical Evaluation   Amniotic F.V:   Within normal limits       F. Tone:  Observed  F. Movement:    Observed                   Score:          8/8  F. Breathing:   Observed ---------------------------------------------------------------------- Biometry  BPD:      84.5  mm     G. Age:  34w 0d         48  %    CI:        76.76   %    70 - 86                                                          FL/HC:      20.6   %    19.4 - 21.8  HC:      305.5  mm     G. Age:  34w 0d         16  %    HC/AC:      1.08        0.96 - 1.11  AC:      282.1  mm     G. Age:  32w 2d         10  %    FL/BPD:     74.3   %    71 - 87  FL:       62.8  mm     G. Age:  32w 4d          9  %    FL/AC:      22.3   %    20 - 24  Est. FW:    2021  gm      4 lb 7 oz     12  % ---------------------------------------------------------------------- OB History  Gravidity:    3         Term:   2 ---------------------------------------------------------------------- Gestational Age  LMP:           34w 0d        Date:  06/01/18                 EDD:   03/08/19  U/S Today:     33w 2d                                        EDD:   03/13/19  Best:          34w 0d     Det. By:  LMP  (06/01/18)          EDD:   03/08/19 ---------------------------------------------------------------------- Anatomy  Cranium:               Appears normal         LVOT:                   Appears normal  Cavum:                 Appears normal         Aortic Arch:            Previously seen  Ventricles:  Appears normal         Ductal Arch:            Previously seen  Choroid Plexus:        Previously seen        Diaphragm:              Appears normal  Cerebellum:            Previously seen        Stomach:                Appears normal, left                                                                        sided  Posterior Fossa:       Previously seen        Abdomen:                Appears normal  Nuchal Fold:           Previously seen        Abdominal Wall:         Previously seen  Face:                   Orbits and profile     Cord Vessels:           Previously seen                         previously seen  Lips:                  Appears normal         Kidneys:                Appear normal  Palate:                Previously seen        Bladder:                Appears normal  Thoracic:              Appears normal         Spine:                  Previously seen  Heart:                 Appears normal         Upper Extremities:      Previously seen                         (4CH, axis, and                         situs)  RVOT:                  Appears normal         Lower Extremities:      Previously seen  Other:  Heels and 5th digit previously visualized. Nasal bone previously          visualized. Fetus appears to be female.  Technically difficult due to          fetal position. ---------------------------------------------------------------------- Doppler - Fetal Vessels  Umbilical Artery   S/D     %tile     RI              PI                     ADFV    RDFV  2.41       42   0.59             0.88                        No      No ---------------------------------------------------------------------- Impression  History of hyperthyroidism. Now euthyroid. Patient has  chronic hepatitis B infection (not on medications).  On ultrasound, fetal growth is appropriate for gestational age  (estimated fetal weight is at the 12 percentile).  Abdominal  circumference measurement is at the 10th percentile.  Amniotic fluid is normal good fetal activity seen.  Fetal heart  rate and rhythm are normal.  Antenatal testing is reassuring.  BPP 8/8.  Umbilical artery Doppler showed normal forward  diastolic flow.  I reassured the patient of the findings. ---------------------------------------------------------------------- Recommendations  -An appointment was made for her to return in 3 weeks for  fetal growth assessment. ----------------------------------------------------------------------                  Noralee Space,  MD Electronically Signed Final Report   01/25/2019 12:13 pm ----------------------------------------------------------------------  [place] MFM UA CORD DOPPLER  Result Date: 02/15/2019 ----------------------------------------------------------------------  OBSTETRICS REPORT                       (Signed Final 02/15/2019 02:01 pm) ---------------------------------------------------------------------- Patient Info  ID #:       [id number]                          D.O.B.:  1980/12/15 (38 yrs)  Name:       Michelle Logan                Visit Date: 02/15/2019 11:09 am ---------------------------------------------------------------------- Performed By  Performed By:     Lenise Arena        Ref. Address:     [address]                                                             [place]                                                             [place] [address]  Attending:        Ma Rings MD         Location[facility]  Referred By:      Jorje Guild CNM ---------------------------------------------------------------------- Orders   #  Description                          Code         Ordered By   1  [place] MFM OB FOLLOW UP                  16109.60     RAVI SHANKAR   2  [place] MFM FETAL BPP WO NON              76819.01     RAVI SHANKAR      STRESS   3  [place] MFM UA CORD DOPPLER               76820.02     RAVI [facility]  ----------------------------------------------------------------------   #  Order #                    Accession #                 Episode #   1  [id number]   2  [id number]                  [id number]   3  [id number]                  [id number]  ---------------------------------------------------------------------- Indications   Maternal care for known or suspected  poor      O36.5930   fetal growth, third trimester, not applicable or   unspecified IUGR   Encounter for other antenatal screening        Z36.2   follow-up   Advanced maternal age multigravida 77+,        O104.523   third trimester   Hepatitis B complicating pregnancy             O98.419, B19.10   Thyroid disease in pregnancy                   O99.280, E07.9   (hyperthyroidism)   Poor obstetric history: Previous gestational   O09.299   diabetes   [redacted] weeks gestation of pregnancy                Z3A.37  ---------------------------------------------------------------------- Vital Signs  Weight (lb): 167                               Height:        5'4"  BMI:         28.66 ---------------------------------------------------------------------- Fetal Evaluation  Num Of Fetuses:         1  Fetal Heart Rate(bpm):  150  Cardiac Activity:       Observed  Presentation:  Cephalic  Placenta:               Posterior  P. Cord Insertion:      Previously Visualized  Amniotic Fluid  AFI FV:      Within normal limits  AFI Sum(cm)     %Tile       Largest Pocket(cm)  10.45           27          3.9  RUQ(cm)       RLQ(cm)       LUQ(cm)        LLQ(cm)  3.55          1.78          1.22           3.9 ---------------------------------------------------------------------- Biophysical Evaluation  Amniotic F.V:   Within normal limits       F. Tone:        Observed  F. Movement:    Observed                   Score:          8/8  F. Breathing:   Observed ---------------------------------------------------------------------- Biometry  BPD:      87.1  mm     G. Age:  35w 1d         17  %    CI:        74.22   %    70 - 86                                                          FL/HC:      21.0   %    20.8 - 22.6  HC:       321   mm     G. Age:  36w 2d         11  %    HC/AC:      1.05        0.92 - 1.05  AC:      304.9  mm     G. Age:  34w 3d          6  %    FL/BPD:     77.5   %    71 - 87  FL:       67.5  mm     G. Age:  34w 5d          5   %    FL/AC:      22.1   %    20 - 24  LV:        3.5  mm  Est. FW:    2511  gm      5 lb 9 oz      9  % ---------------------------------------------------------------------- OB History  Gravidity:    3         Term:   2 ---------------------------------------------------------------------- Gestational Age  LMP:           37w 0d        Date:  06/01/18                 EDD:   03/08/19  U/S Today:  35w 1d                                        EDD:   03/21/19  Best:          37w 0d     Det. By:  LMP  (06/01/18)          EDD:   03/08/19 ---------------------------------------------------------------------- Anatomy  Cranium:               Appears normal         LVOT:                   Previously seen  Cavum:                 Appears normal         Aortic Arch:            Previously seen  Ventricles:            Appears normal         Ductal Arch:            Previously seen  Choroid Plexus:        Previously seen        Diaphragm:              Appears normal  Cerebellum:            Previously seen        Stomach:                Appears normal, left                                                                        sided  Posterior Fossa:       Previously seen        Abdomen:                Appears normal  Nuchal Fold:           Previously seen        Abdominal Wall:         Previously seen  Face:                  Orbits and profile     Cord Vessels:           Previously seen                         previously seen  Lips:                  Appears normal         Kidneys:                Appear normal  Palate:                Previously seen        Bladder:                Appears normal  Thoracic:  Appears normal         Spine:                  Previously seen  Heart:                 Appears normal         Upper Extremities:      Previously seen                         (4CH, axis, and                         situs)  RVOT:                  Previously seen        Lower Extremities:      Previously seen  Other:   Heels and 5th digit previously visualized. Nasal bone previously          visualized. Fetus appears to be female. Technically difficult due to          fetal position. ---------------------------------------------------------------------- Doppler - Fetal Vessels  Umbilical Artery   S/D     %tile                                            ADFV    RDFV  2.43       56                                                No      No ---------------------------------------------------------------------- Cervix Uterus Adnexa  Cervix  Not visualized (advanced GA >24wks) ---------------------------------------------------------------------- Comments  This patient was seen for a follow up growth scan due to a  history of hyperthyroidism.  The overall EFW has measured  in the lower normal ranges during her prior ultrasound exams.  She denies any problems since her last exam and reports  feeling vigorous fetal movements throughout the day.  On today's exam, the EFW measures at the 9th percentile for  her gestational age indicating fetal growth restriction.  There  was normal amniotic fluid noted.  A biophysical profile performed today due to fetal growth  restriction was 8 out of 8.  Doppler studies of the umbilical arteries showed a normal  S/D ratio of 2.43. There were no signs of absent or reversed  end-diastolic flow.  Due to fetal growth restriction, we will continue to follow her  with weekly fetal testing and umbilical artery Doppler studies.  Delivery should probably occur at between 38 to 39 weeks  (next week). ----------------------------------------------------------------------                   Ma Rings, MD Electronically Signed Final Report   02/15/2019 02:01 pm ----------------------------------------------------------------------   Assessment and Plan:  Pregnancy: [id number] at [redacted]w[redacted]d 1. IUGR (intrauterine growth restriction) affecting care of mother, third trimester, not applicable or unspecified fetus Normal  dopplers.  IOL scheduled at 38 2/7 weeks (02/24/19) as per MFM recommendations. Patient informed, orders placed.   2. Thyroid disease affecting pregnancy Surveillance labs ordered. No meds. - TSH - T4, free - T3, free  3.  Chronic hepatitis B without delta agent without hepatic coma (HCC) Surveillance labs ordered. - Hepatitis B DNA, ultraquantitative, PCR - Comprehensive metabolic panel  4. Supervision of high risk pregnancy, antepartum Pelvic cultures ordered - Strep Gp B NAA - GC/Chlamydia probe amp ([place])not at [facility]  Preterm labor symptoms and general obstetric precautions including but not limited to vaginal bleeding, contractions, leaking of fluid and fetal movement were reviewed in detail with the patient. Please refer to After Visit Summary for other counseling recommendations.   No follow-ups on file.  Future Appointments  Date Time Provider Department Center  02/22/2019  8:30 AM WH-MFC NURSE WH-MFC MFC-US  02/22/2019  8:30 AM WH-MFC [place] 1 WH-MFCUS MFC-US  02/24/2019  6:35 AM MC-LD SCHED ROOM MC-INDC None  03/01/2019  4:15 PM Randall Hiss, MD [facility]  09/21/2019  9:45 AM Daiva Eves, Lisette Grinder, MD [facility]    Jaynie Collins, MD

## 2019-02-19 NOTE — Patient Instructions (Signed)
Return to office for any scheduled appointments. Call the office or go to the MAU at [facility] if:  You begin to have strong, frequent contractions  Your water breaks.  Sometimes it is a big gush of fluid, sometimes it is just a trickle that keeps getting your panties wet or running down your legs  You have vaginal bleeding.  It is normal to have a small amount of spotting if your cervix was checked.   You do not feel your baby moving like normal.  If you do not, get something to eat and drink and lay down and focus on feeling your baby move.   If your baby is still not moving like normal, you should call the office or go to MAU.  Any other obstetric concerns.   Labor Induction  Labor induction is when steps are taken to cause a pregnant woman to begin the labor process. Most women go into labor on their own between 37 weeks and 42 weeks of pregnancy. When this does not happen or when there is a medical need for labor to begin, steps may be taken to induce labor. Labor induction causes a pregnant woman's uterus to contract. It also causes the cervix to soften (ripen), open (dilate), and thin out (efface). Usually, labor is not induced before 39 weeks of pregnancy unless there is a medical reason to do so. Your health care provider will determine if labor induction is needed. Before inducing labor, your health care provider will consider a number of factors, including:  Your medical condition and your baby's.  How many weeks along you are in your pregnancy.  How mature your baby's lungs are.  The condition of your cervix.  The position of your baby.  The size of your birth canal. What are some reasons for labor induction? Labor may be induced if:  Your health or your baby's health is at risk.  Your pregnancy is overdue by 1 week or more.  Your water breaks but labor does not start on its own.  There is a low amount of amniotic fluid around your  baby. You may also choose (elect) to have labor induced at a certain time. Generally, elective labor induction is done no earlier than 39 weeks of pregnancy. What methods are used for labor induction? Methods used for labor induction include:  Prostaglandin medicine. This medicine starts contractions and causes the cervix to dilate and ripen. It can be taken by mouth (orally) or by being inserted into the vagina (suppository).  Inserting a small, thin tube (catheter) with a balloon into the vagina and then expanding the balloon with water to dilate the cervix.  Stripping the membranes. In this method, your health care provider gently separates amniotic sac tissue from the cervix. This causes the cervix to stretch, which in turn causes the release of a hormone called progesterone. The hormone causes the uterus to contract. This procedure is often done during an office visit, after which you will be sent home to wait for contractions to begin.  Breaking the water. In this method, your health care provider uses a small instrument to make a small hole in the amniotic sac. This eventually causes the amniotic sac to break. Contractions should begin after a few hours.  Medicine to trigger or strengthen contractions. This medicine is given through an IV that is inserted into a vein in your arm. Except for membrane stripping, which can be done in a clinic, labor   induction is done in the hospital so that you and your baby can be carefully monitored. How long does it take for labor to be induced? The length of time it takes to induce labor depends on how ready your body is for labor. Some inductions can take up to 2-3 days, while others may take less than a day. Induction may take longer if:  You are induced early in your pregnancy.  It is your first pregnancy.  Your cervix is not ready. What are some risks associated with labor induction? Some risks associated with labor induction include:  Changes  in fetal heart rate, such as being too high, too low, or irregular (erratic).  Failed induction.  Infection in the mother or the baby.  Increased risk of having a cesarean delivery.  Fetal death.  Breaking off (abruption) of the placenta from the uterus (rare).  Rupture of the uterus (very rare). When induction is needed for medical reasons, the benefits of induction generally outweigh the risks. What are some reasons for not inducing labor? Labor induction should not be done if:  Your baby does not tolerate contractions.  You have had previous surgeries on your uterus, such as a myomectomy, removal of fibroids, or a vertical scar from a previous cesarean delivery.  Your placenta lies very low in your uterus and blocks the opening of the cervix (placenta previa).  Your baby is not in a head-down position.  The umbilical cord drops down into the birth canal in front of the baby.  There are unusual circumstances, such as the baby being very early (premature).  You have had more than 2 previous cesarean deliveries. Summary  Labor induction is when steps are taken to cause a pregnant woman to begin the labor process.  Labor induction causes a pregnant woman's uterus to contract. It also causes the cervix to ripen, dilate, and efface.  Labor is not induced before 39 weeks of pregnancy unless there is a medical reason to do so.  When induction is needed for medical reasons, the benefits of induction generally outweigh the risks. This information is not intended to replace advice given to you by your health care provider. Make sure you discuss any questions you have with your health care provider. Document Revised: 12/27/2016 Document Reviewed: 02/07/2016 Elsevier Patient Education  2020 Elsevier Inc.  

## 2019-02-20 LAB — COMPREHENSIVE METABOLIC PANEL
ALT: 8 IU/L (ref 0–32)
AST: 14 IU/L (ref 0–40)
Albumin/Globulin Ratio: 1.7 (ref 1.2–2.2)
Albumin: 3.8 g/dL (ref 3.8–4.8)
Alkaline Phosphatase: 261 IU/L — ABNORMAL HIGH (ref 39–117)
BUN/Creatinine Ratio: 11 (ref 9–23)
BUN: 7 mg/dL (ref 6–20)
Bilirubin Total: 0.2 mg/dL (ref 0.0–1.2)
CO2: 23 mmol/L (ref 20–29)
Calcium: 9.7 mg/dL (ref 8.7–10.2)
Chloride: 102 mmol/L (ref 96–106)
Creatinine, Ser: 0.64 mg/dL (ref 0.57–1.00)
GFR calc Af Amer: 133 mL/min/{1.73_m2} (ref >59)
GFR calc non Af Amer: 115 mL/min/{1.73_m2} (ref >59)
Globulin, Total: 2.3 g/dL (ref 1.5–4.5)
Glucose: 73 mg/dL (ref 65–99)
Potassium: 4.3 mmol/L (ref 3.5–5.2)
Sodium: 139 mmol/L (ref 134–144)
Total Protein: 6.1 g/dL (ref 6.0–8.5)

## 2019-02-20 LAB — TSH: TSH: 0.08 u[IU]/mL — ABNORMAL LOW (ref 0.450–4.500)

## 2019-02-20 LAB — HEPATITIS B DNA, ULTRAQUANTITATIVE, PCR
HBV DNA SERPL PCR-ACNC: 270 IU/mL
HBV DNA SERPL PCR-LOG IU: 2.431 log10 IU/mL

## 2019-02-20 LAB — T4, FREE: Free T4: 0.76 ng/dL — ABNORMAL LOW (ref 0.82–1.77)

## 2019-02-20 LAB — T3, FREE: T3, Free: 3.6 pg/mL (ref 2.0–4.4)

## 2019-02-21 LAB — STREP GP B NAA: Strep Gp B NAA: NEGATIVE

## 2019-02-22 ENCOUNTER — Encounter (HOSPITAL_COMMUNITY): Payer: Self-pay

## 2019-02-22 ENCOUNTER — Other Ambulatory Visit (HOSPITAL_COMMUNITY)
Admission: RE | Admit: 2019-02-22 | Discharge: 2019-02-22 | Disposition: A | Discharge status: Home / Self Care | Payer: Medicaid Other | Source: Ambulatory Visit | Attending: Family Medicine | Admitting: Family Medicine

## 2019-02-22 ENCOUNTER — Ambulatory Visit (HOSPITAL_COMMUNITY): Payer: Medicaid Other | Admitting: *Deleted

## 2019-02-22 ENCOUNTER — Ambulatory Visit (HOSPITAL_COMMUNITY)
Admission: RE | Admit: 2019-02-22 | Discharge: 2019-02-22 | Disposition: A | Discharge status: Home / Self Care | Payer: Medicaid Other | Source: Ambulatory Visit | Attending: Obstetrics | Admitting: Obstetrics

## 2019-02-22 ENCOUNTER — Other Ambulatory Visit: Payer: Self-pay

## 2019-02-22 ENCOUNTER — Encounter: Payer: Self-pay | Admitting: General Practice

## 2019-02-22 DIAGNOSIS — Z20822 Contact with and (suspected) exposure to covid-19: Secondary | ICD-10-CM | POA: Diagnosis not present

## 2019-02-22 DIAGNOSIS — O99283 Endocrine, nutritional and metabolic diseases complicating pregnancy, third trimester: Secondary | ICD-10-CM | POA: Insufficient documentation

## 2019-02-22 DIAGNOSIS — O365930 Maternal care for other known or suspected poor fetal growth, third trimester, not applicable or unspecified: Secondary | ICD-10-CM | POA: Insufficient documentation

## 2019-02-22 DIAGNOSIS — B1910 Unspecified viral hepatitis B without hepatic coma: Secondary | ICD-10-CM | POA: Insufficient documentation

## 2019-02-22 DIAGNOSIS — O98413 Viral hepatitis complicating pregnancy, third trimester: Secondary | ICD-10-CM | POA: Insufficient documentation

## 2019-02-22 DIAGNOSIS — O0990 Supervision of high risk pregnancy, unspecified, unspecified trimester: Secondary | ICD-10-CM

## 2019-02-22 DIAGNOSIS — Z01812 Encounter for preprocedural laboratory examination: Secondary | ICD-10-CM | POA: Insufficient documentation

## 2019-02-22 DIAGNOSIS — Z3A38 38 weeks gestation of pregnancy: Secondary | ICD-10-CM | POA: Diagnosis not present

## 2019-02-22 DIAGNOSIS — Z8632 Personal history of gestational diabetes: Secondary | ICD-10-CM | POA: Diagnosis not present

## 2019-02-22 DIAGNOSIS — E0590 Thyrotoxicosis, unspecified without thyrotoxic crisis or storm: Secondary | ICD-10-CM | POA: Insufficient documentation

## 2019-02-22 DIAGNOSIS — O09523 Supervision of elderly multigravida, third trimester: Secondary | ICD-10-CM | POA: Diagnosis not present

## 2019-02-22 DIAGNOSIS — O09299 Supervision of pregnancy with other poor reproductive or obstetric history, unspecified trimester: Secondary | ICD-10-CM

## 2019-02-22 LAB — GC/CHLAMYDIA PROBE AMP (~~LOC~~) NOT AT ARMC
Chlamydia: NEGATIVE
Comment: NEGATIVE
Comment: NORMAL
Neisseria Gonorrhea: NEGATIVE

## 2019-02-22 LAB — SARS CORONAVIRUS 2 (TAT 6-24 HRS): SARS Coronavirus 2: NEGATIVE

## 2019-02-24 ENCOUNTER — Inpatient Hospital Stay (HOSPITAL_COMMUNITY): Payer: Medicaid Other

## 2019-02-24 ENCOUNTER — Encounter (HOSPITAL_COMMUNITY): Payer: Self-pay | Admitting: Obstetrics & Gynecology

## 2019-02-24 ENCOUNTER — Inpatient Hospital Stay (HOSPITAL_COMMUNITY)
Admission: AD | Admit: 2019-02-24 | Discharge: 2019-02-27 | DRG: 806 | Disposition: A | Discharge status: Home / Self Care | Payer: Medicaid Other | Attending: Obstetrics & Gynecology | Admitting: Obstetrics & Gynecology

## 2019-02-24 ENCOUNTER — Other Ambulatory Visit: Payer: Self-pay

## 2019-02-24 DIAGNOSIS — E0590 Thyrotoxicosis, unspecified without thyrotoxic crisis or storm: Secondary | ICD-10-CM | POA: Diagnosis present

## 2019-02-24 DIAGNOSIS — O365930 Maternal care for other known or suspected poor fetal growth, third trimester, not applicable or unspecified: Principal | ICD-10-CM | POA: Diagnosis present

## 2019-02-24 DIAGNOSIS — O99284 Endocrine, nutritional and metabolic diseases complicating childbirth: Secondary | ICD-10-CM | POA: Diagnosis present

## 2019-02-24 DIAGNOSIS — O09299 Supervision of pregnancy with other poor reproductive or obstetric history, unspecified trimester: Secondary | ICD-10-CM

## 2019-02-24 DIAGNOSIS — O9842 Viral hepatitis complicating childbirth: Secondary | ICD-10-CM | POA: Diagnosis present

## 2019-02-24 DIAGNOSIS — B182 Chronic viral hepatitis C: Secondary | ICD-10-CM | POA: Diagnosis not present

## 2019-02-24 DIAGNOSIS — Z30431 Encounter for routine checking of intrauterine contraceptive device: Secondary | ICD-10-CM | POA: Diagnosis present

## 2019-02-24 DIAGNOSIS — Z880 Allergy status to penicillin: Secondary | ICD-10-CM

## 2019-02-24 DIAGNOSIS — O09523 Supervision of elderly multigravida, third trimester: Secondary | ICD-10-CM

## 2019-02-24 DIAGNOSIS — Z30430 Encounter for insertion of intrauterine contraceptive device: Secondary | ICD-10-CM | POA: Diagnosis not present

## 2019-02-24 DIAGNOSIS — Z3A38 38 weeks gestation of pregnancy: Secondary | ICD-10-CM

## 2019-02-24 DIAGNOSIS — O0990 Supervision of high risk pregnancy, unspecified, unspecified trimester: Secondary | ICD-10-CM

## 2019-02-24 DIAGNOSIS — B181 Chronic viral hepatitis B without delta-agent: Secondary | ICD-10-CM | POA: Diagnosis present

## 2019-02-24 DIAGNOSIS — E079 Disorder of thyroid, unspecified: Secondary | ICD-10-CM | POA: Diagnosis present

## 2019-02-24 DIAGNOSIS — O09529 Supervision of elderly multigravida, unspecified trimester: Secondary | ICD-10-CM

## 2019-02-24 DIAGNOSIS — E041 Nontoxic single thyroid nodule: Secondary | ICD-10-CM | POA: Diagnosis present

## 2019-02-24 LAB — CBC
HCT: 35.9 % — ABNORMAL LOW (ref 36.0–46.0)
Hemoglobin: 11.1 g/dL — ABNORMAL LOW (ref 12.0–15.0)
MCH: 25.9 pg — ABNORMAL LOW (ref 26.0–34.0)
MCHC: 30.9 g/dL (ref 30.0–36.0)
MCV: 83.9 fL (ref 80.0–100.0)
Platelets: 208 10*3/uL (ref 150–400)
RBC: 4.28 MIL/uL (ref 3.87–5.11)
RDW: 15.8 % — ABNORMAL HIGH (ref 11.5–15.5)
WBC: 9.2 10*3/uL (ref 4.0–10.5)
nRBC: 0 % (ref 0.0–0.2)

## 2019-02-24 LAB — TYPE AND SCREEN
ABO/RH(D): AB POS
Antibody Screen: NEGATIVE

## 2019-02-24 LAB — ABO/RH: ABO/RH(D): AB POS

## 2019-02-24 MED ORDER — OXYTOCIN BOLUS FROM INFUSION
500.0000 mL | Freq: Once | INTRAVENOUS | Status: AC
  Administered 2019-02-25: 06:00:00 500 mL via INTRAVENOUS

## 2019-02-24 MED ORDER — MISOPROSTOL 25 MCG QUARTER TABLET: 25.0000 ug | ORAL_TABLET | ORAL | Status: DC | PRN

## 2019-02-24 MED ORDER — LACTATED RINGERS IV SOLN: 500.0000 mL | INTRAVENOUS | Status: DC | PRN

## 2019-02-24 MED ORDER — HYDROXYZINE HCL 50 MG PO TABS: 50.0000 mg | ORAL_TABLET | Freq: Four times a day (QID) | ORAL | Status: DC | PRN

## 2019-02-24 MED ORDER — OXYTOCIN 40 UNITS IN NORMAL SALINE INFUSION - SIMPLE MED: 2.5000 [IU]/h | INTRAVENOUS | Status: DC

## 2019-02-24 MED ORDER — OXYCODONE-ACETAMINOPHEN 5-325 MG PO TABS: 2.0000 | ORAL_TABLET | ORAL | Status: DC | PRN

## 2019-02-24 MED ORDER — FENTANYL CITRATE (PF) 100 MCG/2ML IJ SOLN
50.0000 ug | INTRAMUSCULAR | Status: DC | PRN
  Administered 2019-02-25 (×2): 100 ug via INTRAVENOUS
  Filled 2019-02-24: qty 2

## 2019-02-24 MED ORDER — LIDOCAINE HCL (PF) 1 % IJ SOLN: 30.0000 mL | INTRAMUSCULAR | Status: DC | PRN

## 2019-02-24 MED ORDER — OXYTOCIN 40 UNITS IN NORMAL SALINE INFUSION - SIMPLE MED
1.0000 m[IU]/min | INTRAVENOUS | Status: DC
  Administered 2019-02-24: 2 m[IU]/min via INTRAVENOUS
  Filled 2019-02-24: qty 1000

## 2019-02-24 MED ORDER — OXYCODONE-ACETAMINOPHEN 5-325 MG PO TABS: 1.0000 | ORAL_TABLET | ORAL | Status: DC | PRN

## 2019-02-24 MED ORDER — ZOLPIDEM TARTRATE 5 MG PO TABS: 5.0000 mg | ORAL_TABLET | Freq: Every evening | ORAL | Status: DC | PRN

## 2019-02-24 MED ORDER — SOD CITRATE-CITRIC ACID 500-334 MG/5ML PO SOLN: 30.0000 mL | ORAL | Status: DC | PRN

## 2019-02-24 MED ORDER — LACTATED RINGERS IV SOLN: INTRAVENOUS | Status: DC

## 2019-02-24 MED ORDER — TERBUTALINE SULFATE 1 MG/ML IJ SOLN: 0.2500 mg | Freq: Once | INTRAMUSCULAR | Status: DC | PRN

## 2019-02-24 MED ORDER — ONDANSETRON HCL 4 MG/2ML IJ SOLN: 4.0000 mg | Freq: Four times a day (QID) | INTRAMUSCULAR | Status: DC | PRN

## 2019-02-24 MED ORDER — ACETAMINOPHEN 325 MG PO TABS: 650.0000 mg | ORAL_TABLET | ORAL | Status: DC | PRN

## 2019-02-24 MED ORDER — FLEET ENEMA 7-19 GM/118ML RE ENEM: 1.0000 | ENEMA | Freq: Every day | RECTAL | Status: DC | PRN

## 2019-02-24 NOTE — Progress Notes (Signed)
LABOR PROGRESS NOTE  Michelle Logan is a 39 y.o. G3P2002 at [redacted]w[redacted]d admitted for IOL d/t FGR.  Subjective: Patient doing well, asking for water to be broken.   Objective: BP 104/63   Pulse 93   Temp 98 F (36.7 C) (Oral)   Resp 18   Ht 5\' 4"  (1.626 m)   Wt 75.3 kg   LMP 06/01/2018 (Exact Date)   SpO2 100%   BMI 28.49 kg/m  or  Vitals:   02/24/19 2006 02/24/19 2031 02/24/19 2101 02/24/19 2130  BP: (!) 91/48 (!) 96/53 116/69 104/63  Pulse: 91 94 90 93  Resp: 18 18 18 18   Temp:      TempSrc:      SpO2:      Weight:      Height:       Dilation: 4 Effacement (%): 50, 60 Cervical Position: Posterior Station: -3, -2 Presentation: Vertex Exam by:: , DO FHT: baseline rate 140, moderate varibility, 15x15acel, no decels Toco: Pitocin, AROM at 2045  Labs: Lab Results  Component Value Date   WBC 9.2 02/24/2019   HGB 11.1 (L) 02/24/2019   HCT 35.9 (L) 02/24/2019   MCV 83.9 02/24/2019   PLT 208 02/24/2019    Patient Active Problem List   Diagnosis Date Noted  . IUGR (intrauterine growth restriction) affecting care of mother, third trimester, not applicable or unspecified fetus 02/19/2019  . Supervision of high risk pregnancy, antepartum 08/24/2018  . Advanced maternal age in multigravida 08/24/2018  . History of gestational diabetes in prior pregnancy, currently pregnant 08/24/2018  . Thyroid disease affecting pregnancy   . Chronic hepatitis B without delta agent without hepatic coma (HCC) 12/21/2014    Assessment / Plan: 39 y.o. G3P2002 at [redacted]w[redacted]d here for IOL d/t FGR.  Labor: Stage 1 Early Fetal Wellbeing:  Category 1 Pain Control:  Tylenol Anticipated MOD:  Vaginal  Expectant mgmt. Patient AROM'ed at 2045  [redacted]w[redacted]d, DO [facility], PGY-2 02/24/2019 9:51 PM

## 2019-02-24 NOTE — H&P (Signed)
HPI: Michelle Logan is a 39 y.o. year old G55P2002 female at [redacted]w[redacted]d weeks gestation by LMP/18 wk [place] who presents to L&D for IOL due to fetal growth restriction.   Nursing Staff Provider  Office Location  [facility] Dating  LMP/18 wk [place]  Language   English Anatomy US  WNL  Flu Vaccine   09/17/18 Genetic Screen  NIPS: low risk female AFP:   neg   TDaP vaccine   12/28/18 Hgb A1C or  GTT Early 5.2 Third trimester: normal 2hr  Rhogam  NA   LAB RESULTS   Feeding Plan Breast Blood Type AB/Positive/-- (08/24 1209)   Contraception  BTL or PP IUD Antibody Negative (08/24 1209)  Circumcision  Rubella 20.60 (08/24 1209)  Pediatrician  [facility] RPR Non Reactive (08/24 1209)   Support Person [place] (FOB) HBsAg Confirm. indicated (08/24 1209)   Prenatal Classes  HIV Non Reactive (08/24 1209)  BTL Consent  GBS  (For PCN allergy, check sensitivities)   VBAC Consent  Pap  normal 08/2018    Hgb Electro  11/20/16 neg  BP Cuff  has BP cuff CF 11/20/16 neg    SMA 11/20/16 neg    Waterbirth  [ ]  Class [ ]  Consent [ ]  CNM visit   Patient Active Problem List   Diagnosis Date Noted  . IUGR (intrauterine growth restriction) affecting care of mother, third trimester, not applicable or unspecified fetus 02/19/2019  . Supervision of high risk pregnancy, antepartum 08/24/2018  . Advanced maternal age in multigravida 08/24/2018  . History of gestational diabetes in prior pregnancy, currently pregnant 08/24/2018  . Thyroid disease affecting pregnancy   . Chronic hepatitis B without delta agent without hepatic coma (HCC) 12/21/2014     OB History    Gravida  3   Para  2   Term  2   Preterm      AB      Living  2     SAB      TAB      Ectopic      Multiple  0   Live Births  2          Past Medical History:  Diagnosis Date  . Chronic hepatitis B without delta agent without hepatic coma (HCC) 12/21/2014  . Pregnancy and infectious disease 12/21/2014  . Thyroid disease during  pregnancy 12/21/2014  . UTI (urinary tract infection) 12/21/2014   Past Surgical History:  Procedure Laterality Date  . NO PAST SURGERIES     Family History: family history is not on file. Social History:  reports that she has never smoked. She has never used smokeless tobacco. She reports that she does not drink alcohol or use drugs.     Maternal Diabetes: No Genetic Screening: Normal Maternal Ultrasounds/Referrals: IUGR Fetal Ultrasounds or other Referrals:  Referred to Materal Fetal Medicine  Maternal Substance Abuse:  No Significant Maternal Medications:  None Significant Maternal Lab Results:  Group B Strep negative, HBsAG positive and Other:  Other Comments:  FGR, Nml dopplers and AFI, Hyperthryroid (no meds), Chronic hepatitis B without delta agent without coma or cirrhosis  Review of Systems  Constitutional: Negative for chills and fever.  Gastrointestinal: Negative for abdominal pain, nausea and vomiting.  Genitourinary: Negative for vaginal bleeding and vaginal discharge.   Maternal Medical History:  Reason for admission: Nausea. IOL for FGR  Contractions: Frequency: rare.   Perceived severity is mild.    Fetal activity: Perceived fetal activity is normal.  Prenatal complications: IUGR.   No bleeding, PIH, oligohydramnios, polyhydramnios or pre-eclampsia.   Prenatal Complications - Diabetes: none.    Dilation: 2 Effacement (%): 50 Station: -3 Exam by:: Wendi Maya, RN Blood pressure 107/70, pulse 92, temperature 98.3 F (36.8 C), temperature source Oral, resp. rate 18, height 5\' 4"  (1.626 m), weight 75.3 kg, last menstrual period 06/01/2018, SpO2 100 %, currently breastfeeding. Maternal Exam:  Uterine Assessment: Contraction strength is mild.  Contraction frequency is rare.   Abdomen: Patient reports no abdominal tenderness. Estimated fetal weight is 6 lb (EFW 5-9 per [place] 02/15/19).   Fetal presentation: vertex  Pelvis: adequate for delivery.   Cervix:  Cervix evaluated by digital exam.     Fetal Exam Fetal Monitor Review: Mode: ultrasound.   Variability: moderate (6-25 bpm).   Pattern: accelerations present and variable decelerations.    Fetal State Assessment: Category I - tracings are normal.     Physical Exam  Nursing note and vitals reviewed. Constitutional: She is oriented to person, place, and time. She appears well-developed and well-nourished. No distress.  HENT:  Head: Atraumatic.  Eyes: No scleral icterus.  Cardiovascular: Normal rate.  Respiratory: Effort normal. No respiratory distress.  GI: Soft. There is no abdominal tenderness.  Musculoskeletal:        General: No edema.  Neurological: She is alert and oriented to person, place, and time. She has normal reflexes.  Skin: Skin is warm and dry.  Psychiatric: She has a normal mood and affect.    Prenatal labs: ABO, Rh: --/--/AB POS (02/17 0910) Antibody: NEG (02/17 0910) Rubella: 20.60 (08/24 1209) RPR: Non Reactive (12/01 0844)  HBsAg: Confirm. indicated (08/24 1209)  HIV: Non Reactive (12/01 0844)  GBS: --[place]end[place] Cloud (02/12 1207)   Assessment: 1. Labor: IOL for FGR 2. Fetal Wellbeing: Category I-II (? Variable x 1)  3. Pain Control: PRN 4. GBS: Neg 5. 38.2 week IUP 6. Hyperthyroid 7. Chronic Hep B followed by ID  Plan:  1. Admit to BS per consult with MD 2. Routine L&D orders 3. Analgesia/anesthesia PRN  4. Pitocin (Cytotec may be necessary if pitocin not effective, but will start with pitocin due to possible decel and concerns about fetal tolerance of labor in setting of FGR. Able to turn off pitocin if needed.)  [place] 02/24/2019, 10:34 AM

## 2019-02-24 NOTE — Progress Notes (Signed)
Michelle Logan is a 39 y.o. G3P2002 at [redacted]w[redacted]d.  Subjective: More uncomfortable w/ UC's  Objective: BP 110/70   Pulse 90   Temp 98.2 F (36.8 C) (Oral)   Resp 18   Ht 5\' 4"  (1.626 m)   Wt 75.3 kg   LMP 06/01/2018 (Exact Date)   SpO2 100%   BMI 28.49 kg/m    FHT:  FHR: 135 bpm, variability: mod,  accelerations:  15x15,  decelerations:  none UC:   Q 2-4 minutes, moderate Dilation: 3.5 Effacement (%): 50 Cervical Position: Posterior Station: -2 Presentation: Vertex Exam by:: [email or web address], RN  Labs: Results for orders placed or performed during the hospital encounter of 02/24/19 (from the past 24 hour(s))  Type and screen     Status: None   Collection Time: 02/24/19  9:10 AM  Result Value Ref Range   ABO/RH(D) AB POS    Antibody Screen NEG    Sample Expiration      02/27/2019,2359 Performed at [facility], [address], [place], [place]   ABO/Rh     Status: None   Collection Time: 02/24/19  9:10 AM  Result Value Ref Range   ABO/RH(D)      AB POS Performed at [facility], [address], [place], [place]   CBC     Status: Abnormal   Collection Time: 02/24/19  9:17 AM  Result Value Ref Range   WBC 9.2 4.0 - 10.5 K/uL   RBC 4.28 3.87 - 5.11 MIL/uL   Hemoglobin 11.1 (L) 12.0 - 15.0 g/dL   HCT 02/26/19 (L) 68.3 - 41.9 %   MCV 83.9 80.0 - 100.0 fL   MCH 25.9 (L) 26.0 - 34.0 pg   MCHC 30.9 30.0 - 36.0 g/dL   RDW 62.2 (H) 29.7 - 98.9 %   Platelets 208 150 - 400 K/uL   nRBC 0.0 0.0 - 0.2 %    Assessment / Plan: [redacted]w[redacted]d week IUP Labor: Early IOL Fetal Wellbeing:  Category I Pain Control:  Comfort measures Anticipated MOD:  SVD AROM when well-applied  [place], [place], CNM 02/24/2019 4:32 PM

## 2019-02-25 ENCOUNTER — Encounter (HOSPITAL_COMMUNITY): Payer: Self-pay | Admitting: Obstetrics & Gynecology

## 2019-02-25 DIAGNOSIS — O9842 Viral hepatitis complicating childbirth: Secondary | ICD-10-CM | POA: Diagnosis not present

## 2019-02-25 DIAGNOSIS — Z3A38 38 weeks gestation of pregnancy: Secondary | ICD-10-CM

## 2019-02-25 DIAGNOSIS — O365930 Maternal care for other known or suspected poor fetal growth, third trimester, not applicable or unspecified: Secondary | ICD-10-CM | POA: Diagnosis not present

## 2019-02-25 DIAGNOSIS — B182 Chronic viral hepatitis C: Secondary | ICD-10-CM

## 2019-02-25 DIAGNOSIS — Z30431 Encounter for routine checking of intrauterine contraceptive device: Secondary | ICD-10-CM | POA: Diagnosis not present

## 2019-02-25 DIAGNOSIS — O99284 Endocrine, nutritional and metabolic diseases complicating childbirth: Secondary | ICD-10-CM | POA: Diagnosis not present

## 2019-02-25 LAB — RPR: RPR Ser Ql: NONREACTIVE

## 2019-02-25 MED ORDER — PARAGARD INTRAUTERINE COPPER IU IUD
INTRAUTERINE_SYSTEM | Freq: Once | INTRAUTERINE | Status: AC
  Administered 2019-02-25: 07:00:00 1 via INTRAUTERINE
  Filled 2019-02-25: qty 1

## 2019-02-25 MED ORDER — SENNOSIDES-DOCUSATE SODIUM 8.6-50 MG PO TABS
2.0000 | ORAL_TABLET | ORAL | Status: DC
  Administered 2019-02-25 – 2019-02-26 (×2): 2 via ORAL
  Filled 2019-02-25 (×2): qty 2

## 2019-02-25 MED ORDER — TETANUS-DIPHTH-ACELL PERTUSSIS 5-2.5-18.5 LF-MCG/0.5 IM SUSP: 0.5000 mL | Freq: Once | INTRAMUSCULAR | Status: DC

## 2019-02-25 MED ORDER — COCONUT OIL OIL: 1.0000 "application " | TOPICAL_OIL | Status: DC | PRN

## 2019-02-25 MED ORDER — ONDANSETRON HCL 4 MG PO TABS: 4.0000 mg | ORAL_TABLET | ORAL | Status: DC | PRN

## 2019-02-25 MED ORDER — DIBUCAINE (PERIANAL) 1 % EX OINT: 1.0000 "application " | TOPICAL_OINTMENT | CUTANEOUS | Status: DC | PRN

## 2019-02-25 MED ORDER — LEVONORGESTREL 19.5 MCG/DAY IU IUD
INTRAUTERINE_SYSTEM | INTRAUTERINE | Status: AC
  Filled 2019-02-25: qty 1

## 2019-02-25 MED ORDER — NON FORMULARY: 1.0000 "application " | Freq: Once | Status: DC

## 2019-02-25 MED ORDER — ZOLPIDEM TARTRATE 5 MG PO TABS: 5.0000 mg | ORAL_TABLET | Freq: Every evening | ORAL | Status: DC | PRN

## 2019-02-25 MED ORDER — LEVONORGESTREL 19.5 MCG/DAY IU IUD: INTRAUTERINE_SYSTEM | Freq: Once | INTRAUTERINE | Status: DC

## 2019-02-25 MED ORDER — IBUPROFEN 600 MG PO TABS
600.0000 mg | ORAL_TABLET | Freq: Four times a day (QID) | ORAL | Status: DC
  Administered 2019-02-25 – 2019-02-27 (×9): 600 mg via ORAL
  Filled 2019-02-25 (×9): qty 1

## 2019-02-25 MED ORDER — ONDANSETRON HCL 4 MG/2ML IJ SOLN: 4.0000 mg | INTRAMUSCULAR | Status: DC | PRN

## 2019-02-25 MED ORDER — PRENATAL MULTIVITAMIN CH
1.0000 | ORAL_TABLET | Freq: Every day | ORAL | Status: DC
  Administered 2019-02-25 – 2019-02-27 (×3): 1 via ORAL
  Filled 2019-02-25 (×3): qty 1

## 2019-02-25 MED ORDER — SIMETHICONE 80 MG PO CHEW: 80.0000 mg | CHEWABLE_TABLET | ORAL | Status: DC | PRN

## 2019-02-25 MED ORDER — WITCH HAZEL-GLYCERIN EX PADS: 1.0000 "application " | MEDICATED_PAD | CUTANEOUS | Status: DC | PRN

## 2019-02-25 MED ORDER — ACETAMINOPHEN 325 MG PO TABS
650.0000 mg | ORAL_TABLET | ORAL | Status: DC | PRN
  Filled 2019-02-25: qty 2

## 2019-02-25 MED ORDER — BENZOCAINE-MENTHOL 20-0.5 % EX AERO: 1.0000 "application " | INHALATION_SPRAY | CUTANEOUS | Status: DC | PRN

## 2019-02-25 MED ORDER — DIPHENHYDRAMINE HCL 25 MG PO CAPS: 25.0000 mg | ORAL_CAPSULE | Freq: Four times a day (QID) | ORAL | Status: DC | PRN

## 2019-02-25 NOTE — Discharge Summary (Signed)
OB Discharge Summary     Patient Name: Michelle Logan DOB: 1980/08/10 MRN: [id number]  Date of admission: 02/24/2019 Delivering MD: Milus Banister C   Date of discharge: 02/26/2019  Admitting diagnosis: IUGR (intrauterine growth restriction) affecting care of mother, third trimester, not applicable or unspecified fetus [O36.5930] Intrauterine pregnancy: [redacted]w[redacted]d    Secondary diagnosis:  Active Problems:   Chronic hepatitis B without delta agent without hepatic coma ([place]   Thyroid disease affecting pregnancy   Advanced maternal age in multigravida   IUGR (intrauterine growth restriction) affecting care of mother, third trimester, not applicable or unspecified fetus  Additional problems:  IOL due to Fetal Growth Restriction HISTORY of gDM in prior pregnancy     Discharge diagnosis: Term Pregnancy Delivered                                                                                                Post partum procedures:ppIUD placed (Paragard)  Augmentation: AROM and Pitocin  Complications: None  Hospital course:  Induction of Labor With Vaginal Delivery   39y.o. yo G3P2002 at 337w3das admitted to the hospital 02/24/2019 for induction of labor.  Indication for induction: FGR.  Patient had an uncomplicated labor course, delivering approx 18hrs after induction began. Paragard placed postplacentally. Membrane Rupture Time/Date: 8:45 PM ,02/24/2019   Intrapartum Procedures: Episiotomy: None [1]                                         Lacerations:  1st degree [2]  Patient had delivery of a Viable infant.  Information for the patient's newborn:  AdChristeena, Kroghirl AbMiriah0[[id number]]Delivery Method: Vaginal, Spontaneous(Filed from Delivery Summary)    02/25/2019  Details of delivery can be found in separate delivery note.  Patient had a routine postpartum course. Patient is discharged home 02/26/19.  Physical exam  Vitals:   02/25/19 1245 02/25/19 1816  02/25/19 2107 02/26/19 0555  BP: 95/64 94/64 (!) 103/58 (!) 88/62  Pulse: 89 91 85 83  Resp: '18 17 18 18  '$ Temp: 98.3 F (36.8 C) 98.1 F (36.7 C) 98.1 F (36.7 C) 97.9 F (36.6 C)  TempSrc: Oral Oral Axillary Oral  SpO2: 100% 99% 99% 100%  Weight:      Height:       General: alert, cooperative and no distress Lochia: appropriate Uterine Fundus: firm Incision: N/A DVT Evaluation: No evidence of DVT seen on physical exam. Labs: Lab Results  Component Value Date   WBC 11.8 (H) 02/26/2019   HGB 9.0 (L) 02/26/2019   HCT 28.7 (L) 02/26/2019   MCV 82.9 02/26/2019   PLT 207 02/26/2019   CMP Latest Ref Rng & Units 02/19/2019  Glucose 65 - 99 mg/dL 73  BUN 6 - 20 mg/dL 7  Creatinine 0.57 - 1.00 mg/dL 0.64  Sodium 134 - 144 mmol/L 139  Potassium  3.5 - 5.2 mmol/L 4.3  Chloride 96 - 106 mmol/L 102  CO2 20 - 29 mmol/L 23  Calcium 8.7 - 10.2 mg/dL 9.7  Total Protein 6.0 - 8.5 g/dL 6.1  Total Bilirubin 0.0 - 1.2 mg/dL 0.2  Alkaline Phos 39 - 117 IU/L 261(H)  AST 0 - 40 IU/L 14  ALT 0 - 32 IU/L 8    Discharge instruction: per After Visit Summary and "Baby and Me Booklet".  After visit meds:  Allergies as of 02/26/2019   No Known Allergies     Medication List    TAKE these medications   acetaminophen 325 MG tablet Commonly known as: Tylenol Take 2 tablets (650 mg total) by mouth every 4 (four) hours as needed (for pain scale < 4). What changed:   when to take this  reasons to take this   Blood Pressure Kit Devi 1 Device by Does not apply route as needed. ICD 10:  O09.90   calcium carbonate 500 MG chewable tablet Commonly known as: TUMS - dosed in mg elemental calcium Chew 1 tablet by mouth 2 (two) times daily as needed for indigestion or heartburn.   ibuprofen 600 MG tablet Commonly known as: ADVIL Take 1 tablet (600 mg total) by mouth every 6 (six) hours.   Vitafol Ultra 29-0.6-0.4-200 MG Caps TAKE 1 CAPSULE BY MOUTH DAILY       Diet: routine  diet  Activity: Advance as tolerated. Pelvic rest for 6 weeks.   Outpatient follow up: 4 weeks Follow up Appt: Future Appointments  Date Time Provider [place]  03/01/2019  4:15 PM Tommy Medal, Lavell Islam, MD [facility]  09/21/2019  9:45 AM Tommy Medal, Lavell Islam, MD [facility]   Follow up Visit:No follow-ups on file.   Please schedule this patient for Postpartum visit in: 4 weeks with the following provider: Any provider Virtual (unless she needs IUD strings trimmed) For C/S patients schedule nurse incision check in weeks 2 weeks: no High risk pregnancy complicated by: FGR, chronic Hep B Delivery mode:  SVD Anticipated Birth Control:  PP IUD Placed PP Procedures needed: might need IUD string trimmed  Schedule Integrated BH visit: no   Postpartum contraception: IUD Paragard- placed  Newborn Data: Live born female  Birth Weight: 2146gm (4lb 11.7oz)  APGAR: 86, 9  Newborn Delivery   Birth date/time: 02/25/2019 05:45:00 Delivery type: Vaginal, Spontaneous      Baby Feeding: Breast Disposition: home with mother   02/26/2019 Merilyn Baba, DO

## 2019-02-25 NOTE — Lactation Note (Signed)
This note was copied from a baby's chart. Lactation Consultation Note  Patient Name: Girl Tommy Minichiello ZXYOF'V Date: 02/25/2019 Reason for consult: Initial assessment  Ebonie, RN was made aware that there should be enough EBM in room to assist with infant's low blood sugar. I also asked RN to show Mom how to wash her pump parts.    Lurline Hare [facility] 02/25/2019, 12:06 PM

## 2019-02-25 NOTE — Lactation Note (Addendum)
This note was copied from a baby's chart. Lactation Consultation Note  Patient Name: Michelle Logan OQUCL'T Date: 02/25/2019 Reason for consult: Initial assessment  Initial visit at 4 hours of life. Infant was cueing when I walked in. When I mentioned it, Mom readily put infant to the breast, but I did assist with alignment & remind her a few things about nursing a newborn (as compared to an older baby).   Mom is a P3 who nursed her 1st child for 14 months & her 2nd child for 12 months. Mom reports that she has small babies, but this is her smallest. Mom reports that, in the past, her milk has come to volume "immediately." In light of infant's small size, I asked Mom about willingness to express her milk. Mom chose to use a hand pump for the time being. Currently, infant is nursing well.   Mom has chronic Hep B. This LC to return to discuss how to handle if nipples were to bleed & to go over lactation brochure.   Lurline Hare [facility] 02/25/2019, 10:06 AM

## 2019-02-25 NOTE — Procedures (Signed)
IUD INSERTION: Patient given informed consent, signed copy in the chart.  Appropriate time out taken.   Sterile instruments and technique was used. Cervix brought into view with use of speculum; a ring forceps was placed into the anterior lip of the cervix.   A Paragard IUD was placed into the endometrial cavity, deployed and secured. The applicator was removed. Approx 2cm of the string length was trimmed, with remaining string visible at the introitus.   There were no complications and the patient tolerated the procedure well.   She was given a card with the time of recommended removal. She was reminded that the IUD does not protect against sexually transmitted diseases.  [name] CNM 02/25/2019 8:08 AM

## 2019-02-25 NOTE — Lactation Note (Signed)
This note was copied from a baby's chart. Lactation Consultation Note  Patient Name: Michelle Logan ESLPN'P Date: 02/25/2019 Reason for consult: Initial assessment  Mom was provided a size 27 flange after I observed her pumping with the size 24 flange, which appeared a bit snug. Mom agreed that the size 27 flange was more comfortable.  I talked to Mom about not providing breast milk from a breast if she has bleeding nipples. Mom nodded, saying she knew and that she has never had any difficulty with feeding her other children (who are now 6 yo & 38 yo).   Mom will call me when she is done pumping.   Lurline Hare [facility] 02/25/2019, 10:37 AM

## 2019-02-26 LAB — CBC
HCT: 28.7 % — ABNORMAL LOW (ref 36.0–46.0)
Hemoglobin: 9 g/dL — ABNORMAL LOW (ref 12.0–15.0)
MCH: 26 pg (ref 26.0–34.0)
MCHC: 31.4 g/dL (ref 30.0–36.0)
MCV: 82.9 fL (ref 80.0–100.0)
Platelets: 207 10*3/uL (ref 150–400)
RBC: 3.46 MIL/uL — ABNORMAL LOW (ref 3.87–5.11)
RDW: 15.9 % — ABNORMAL HIGH (ref 11.5–15.5)
WBC: 11.8 10*3/uL — ABNORMAL HIGH (ref 4.0–10.5)
nRBC: 0 % (ref 0.0–0.2)

## 2019-02-26 MED ORDER — ACETAMINOPHEN 325 MG PO TABS: 650.0000 mg | ORAL_TABLET | ORAL | 0 refills | Status: DC | PRN

## 2019-02-26 MED ORDER — IBUPROFEN 600 MG PO TABS: 600.0000 mg | ORAL_TABLET | Freq: Four times a day (QID) | ORAL | 0 refills | Status: DC

## 2019-02-26 NOTE — Lactation Note (Signed)
This note was copied from a baby's chart. Lactation Consultation Note  Patient Name: Michelle Logan MEQAS'T Date: 02/26/2019 Reason for consult: Follow-up assessment;Early term 37-38.6wks;Infant < 6lbs  1408 - 1439 - I followed up with Ms. Fargnoli and her 64 hour old daughter. She reports that baby last fed about 4 hours prior to my visit for 20 minutes. Baby was in bassinet cueing; Ms. Rossitto was preparing for a shower. I pointed out that she was cueing and Ms. Bodkins offered to feed her.  I changed a meconium diaper first and then observed Ms. Jessie latch baby in football hold on her left breast. Baby latched immediately to her everted nipples. The latch appeared somewhat shallow. I observed rhythmic suckling sequences and heard what sounded like a few swallows (debatable).  Baby was sleepy at the breast and lasted about five minutes before releasing. Ms. Sample has expressed breast milk in the refrigerator. I brought her a feeding cup and curved tip syringe and suggested that we supplement. We attempted with the cup first and she pushed the milk back out of her mouth without swallowing (too sleepy). I then suggested that mom allow her to suckle on her finger and we attempted with the curved tip syringe. Again, baby was too sleepy to suck on her finger.  I suggested that Ms. Camper try again in about 30 minutes to an hour, and if baby will not wake to feed with cup, spoon, or syringe, that we use a bottle nipple. I recommended breast feeding on demand 8-12 times a day and then supplementing 10 mls of her EBM following.  I offered to return later this evening for follow up.   Maternal Data Formula Feeding for Exclusion: No Has patient been taught Hand Expression?: Yes Does the patient have breastfeeding experience prior to this delivery?: Yes  Feeding Feeding Type: Breast Fed  LATCH Score Latch: Repeated attempts needed to sustain latch, nipple held in mouth throughout  feeding, stimulation needed to elicit sucking reflex.  Audible Swallowing: A few with stimulation  Type of Nipple: Everted at rest and after stimulation  Comfort (Breast/Nipple): Soft / non-tender  Hold (Positioning): No assistance needed to correctly position infant at breast.  LATCH Score: 8  Interventions Interventions: Breast feeding basics reviewed;Skin to skin;Hand express;Hand pump(feeding cup)  Lactation Tools Discussed/Used Pump Review: Setup, frequency, and cleaning;Milk Storage   Consult Status Consult Status: Follow-up Date: 02/25/19 Follow-up type: In-patient    Walker Shadow 02/26/2019, 2:47 PM

## 2019-02-26 NOTE — Discharge Instructions (Signed)

## 2019-02-26 NOTE — Progress Notes (Signed)
Notified by RN that patient's IUD fell out today. Message sent to [facility] for PP visit to be in-person for replacement.  Jerilynn Birkenhead, MD [facility] Family Medicine Fellow, [facility] for [employer], [facility]

## 2019-02-27 NOTE — Lactation Note (Signed)
This note was copied from a baby's chart. Lactation Consultation Note  Patient Name: Michelle Logan Date: 02/27/2019 Reason for consult: Follow-up assessment  P3 mother whose infant is now 51 hours old.  This is an ETI at 38+3 weeks.  Mother breast fed her first child(now 37 years old)  for 14 months and her second child now 29 years old) for 12 months.  Mother was using the manual pump and obtaining a large volume of EBM when I arrived.  She had no questions/concerns related to breast feeding.  She has always had a good milk supply and feels comfortable with using her manual pump.  She does not have a DEBP nor does she desire one.  I observed mother pumping with the #27 flange size and provided a #30 flange to take home due to her large nipple and I wanted to be sure that, with enlargement, mother felt comfortable and was using the correct flange size.  Mother appreciative.  Mother has our OP phone number for questions/concerns after discharge.  Father present.  Engorgement prevention treatment reviewed with mother.      Maternal Data    Feeding Feeding Type: Breast Fed  LATCH Score Latch: Grasps breast easily, tongue down, lips flanged, rhythmical sucking.  Audible Swallowing: Spontaneous and intermittent  Type of Nipple: Everted at rest and after stimulation  Comfort (Breast/Nipple): Soft / non-tender  Hold (Positioning): No assistance needed to correctly position infant at breast.  LATCH Score: 10  Interventions    Lactation Tools Discussed/Used     Consult Status Consult Status: Complete Date: 02/27/19 Follow-up type: Call as needed    [name] 02/27/2019, 9:40 AM

## 2019-03-01 ENCOUNTER — Ambulatory Visit: Payer: Medicaid Other | Admitting: Infectious Disease

## 2019-04-01 ENCOUNTER — Ambulatory Visit: Payer: Medicaid Other | Admitting: Obstetrics and Gynecology

## 2019-04-15 IMAGING — US US MFM OB FOLLOW-UP
1 series · 13 of 28 positions shown · non-contrast
Comparison: none

[Series 1: us mfm ob follow-up · 53 acquisitions, 13 frames shown]
[im 2/53]
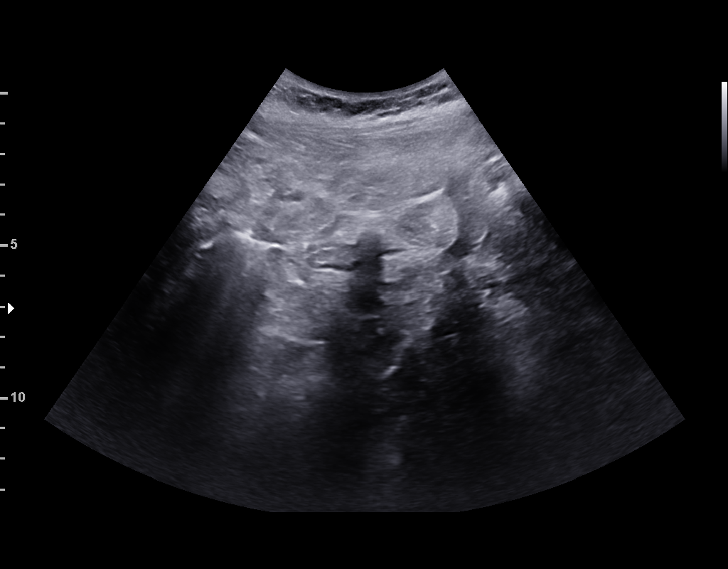
[im 6/53]
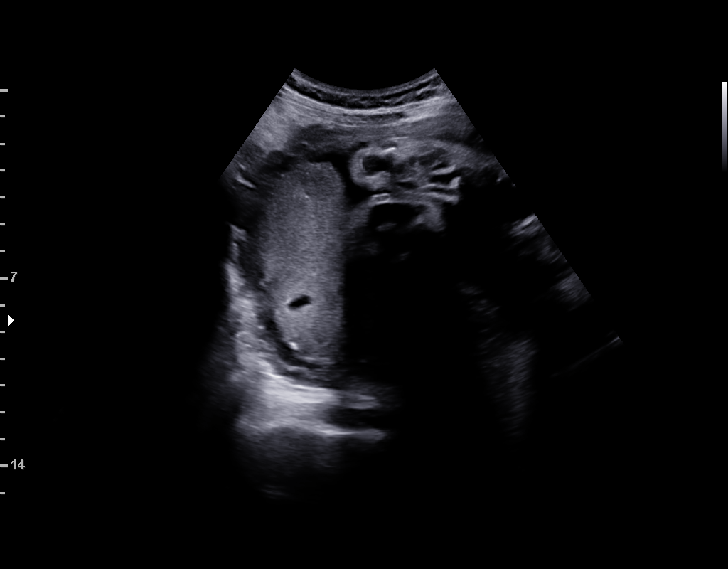
[im 10/53]
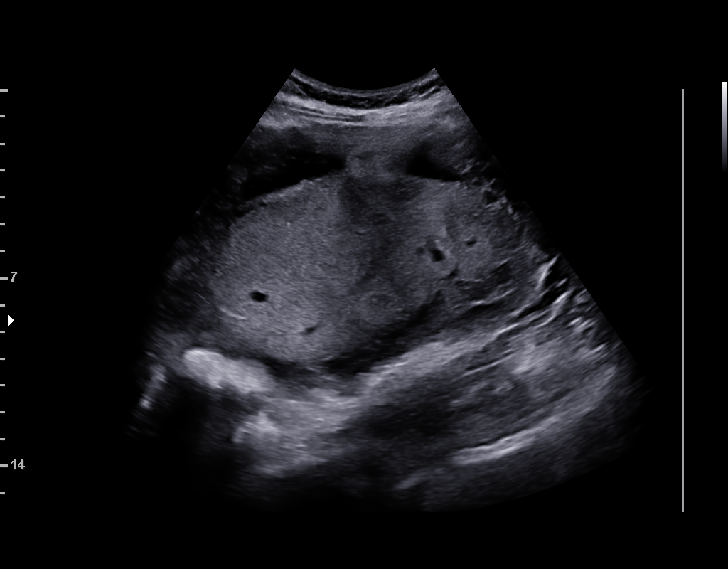
[im 14/53]
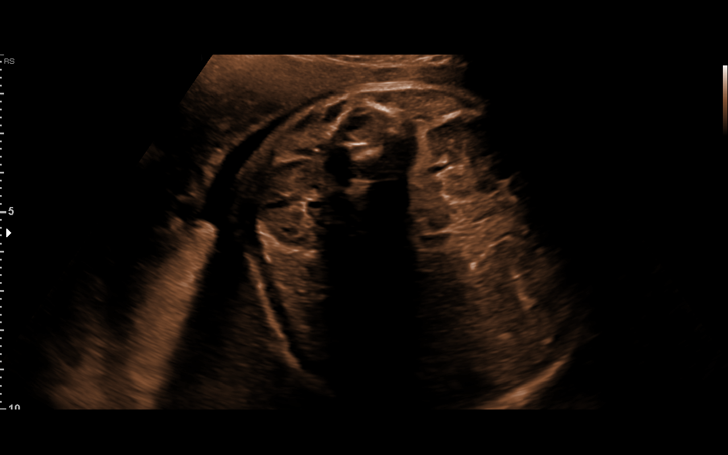
[im 18/53]
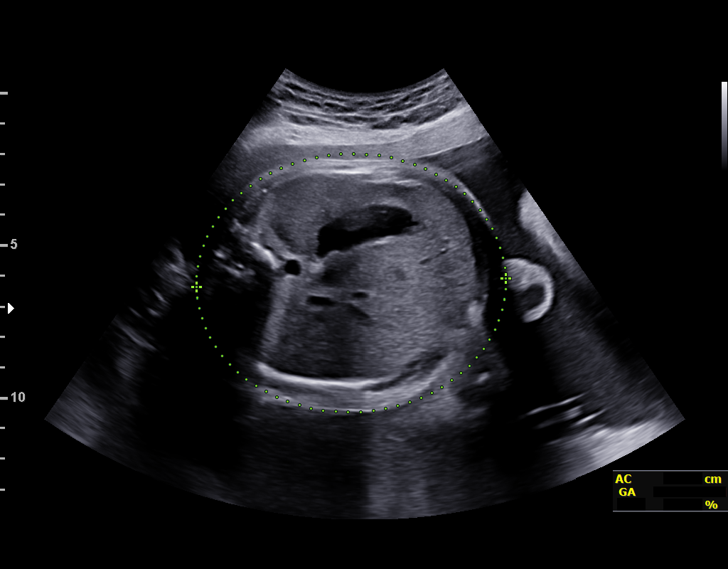
[im 22/53]
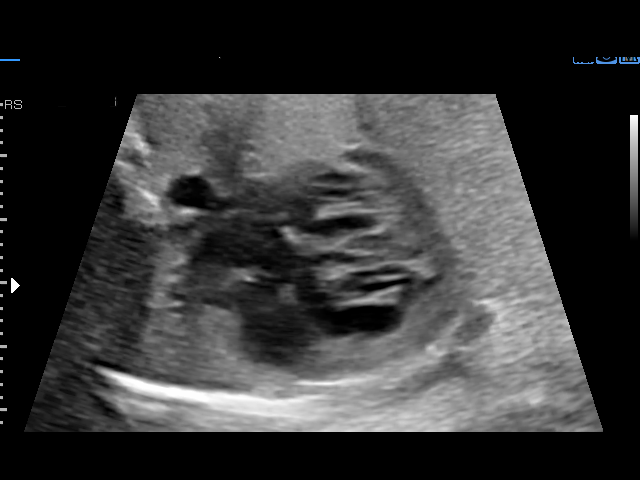
[im 27/53]
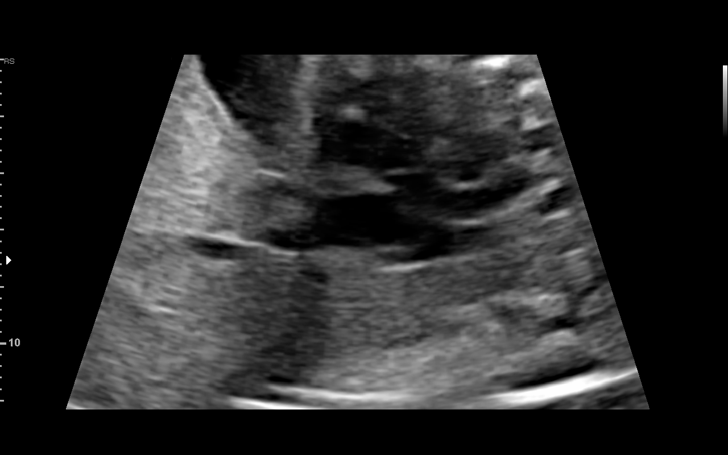
[im 31/53]
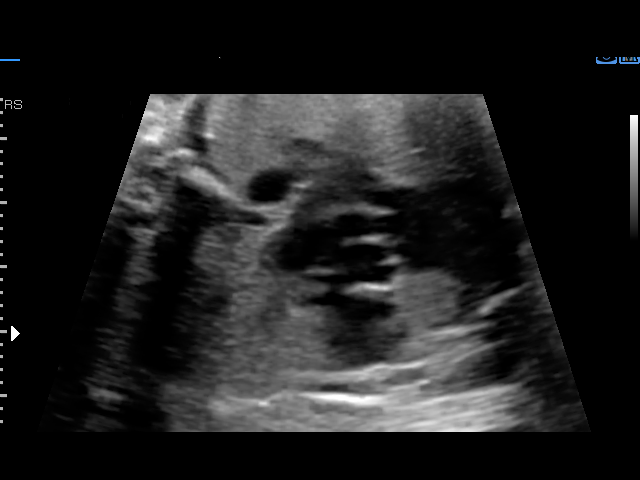
[im 35/53]
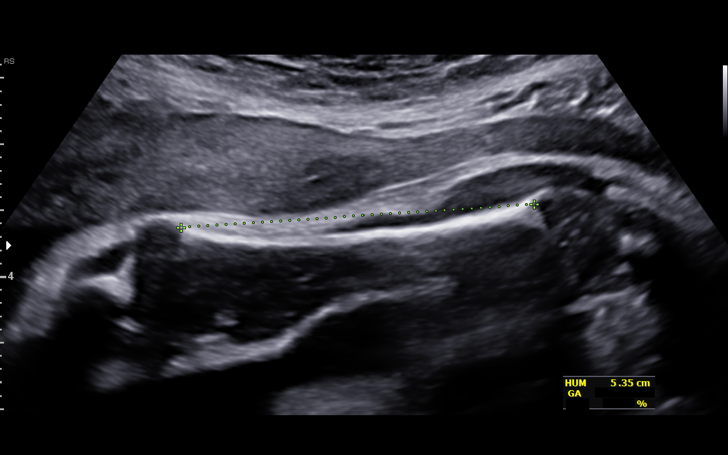
[im 39/53]
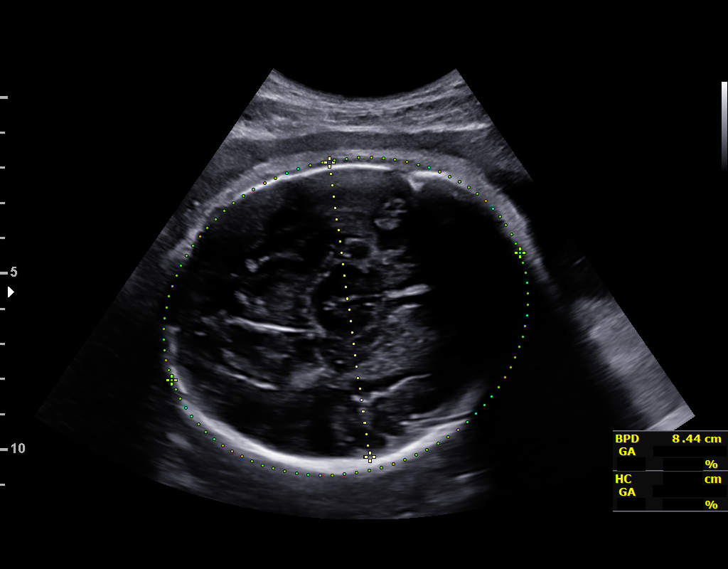
[im 43/53]
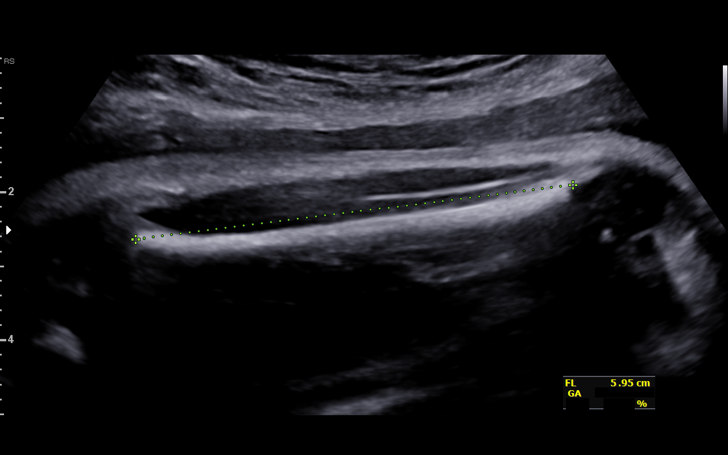
[im 47/53]
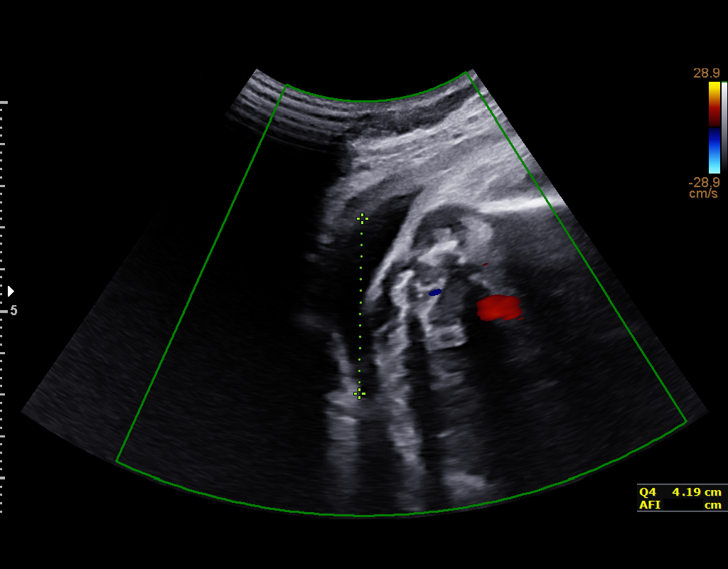
[im 51/53]
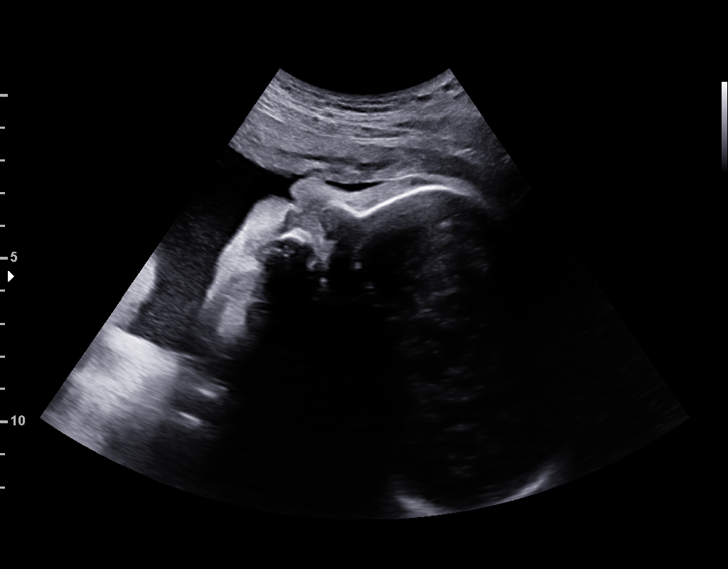

[13 of 28 positions shown; findings below may reference images not displayed]

Indications

32 weeks gestation of pregnancy
Thyroid disease in pregnancy                   O99.280,
Hepatitis B complicating pregnancy             O98.419,
Advanced maternal age primigravida 35+,
third trimester
OB History

Blood Type:            Height:  5'4"   Weight (lb):  157       BMI:
Gravidity:    2         Term:   1
Living:       1
Fetal Evaluation

Num Of Fetuses:     1
Fetal Heart         148
Rate(bpm):
Cardiac Activity:   Observed
Presentation:       Cephalic
Placenta:           Posterior, above cervical os
P. Cord Insertion:  Previously Visualized

Amniotic Fluid
AFI FV:      Subjectively within normal limits

AFI Sum(cm)     %Tile       Largest Pocket(cm)
16.46           59

RUQ(cm)       RLQ(cm)       LUQ(cm)        LLQ(cm)
4.65
Biometry

BPD:      83.8  mm     G. Age:  33w 5d         82  %    CI:        74.07   %    70 - 86
FL/HC:      19.3   %    19.1 -
HC:      309.2  mm     G. Age:  34w 4d         73  %    HC/AC:      1.04        0.96 -
AC:      296.7  mm     G. Age:  33w 5d         84  %    FL/BPD:     71.2   %    71 - 87
FL:       59.7  mm     G. Age:  31w 1d         12  %    FL/AC:      20.1   %    20 - 24
HUM:      53.5  mm     G. Age:  31w 1d         30  %

Est. FW:    4255  gm    4 lb 10 oz      71  %
Gestational Age

LMP:           32w 2d        Date:  08/19/16                 EDD:   05/26/17
U/S Today:     33w 2d                                        EDD:   05/19/17
Best:          32w 2d     Det. By:  LMP  (08/19/16)          EDD:   05/26/17
Anatomy

Cranium:               Appears normal         Aortic Arch:            Previously seen
Cavum:                 Previously seen        Ductal Arch:            Previously seen
Ventricles:            Appears normal         Diaphragm:              Previously seen
Choroid Plexus:        Previously seen        Stomach:                Appears normal, left
sided
Cerebellum:            Previously seen        Abdomen:                Appears normal
Posterior Fossa:       Previously seen        Abdominal Wall:         Previously seen
Nuchal Fold:           Not applicable (>20    Cord Vessels:           Previously seen
wks GA)
Face:                  Orbits previously      Kidneys:                Appear normal
seen
Lips:                  Previously seen        Bladder:                Appears normal
Thoracic:              Appears normal         Spine:                  Previously seen
Heart:                 Appears normal         Upper Extremities:      Previously seen
(4CH, axis, and si
RVOT:                  Previously seen        Lower Extremities:      Previously seen
LVOT:                  Appears normal

Other:  Fetus appears to be a male. Heels and RT 5th digit previously seen.
Open hands previously visualized. Nasal bone previously visualized.
Technically difficult due to fetal position.
Cervix Uterus Adnexa

Cervix
Not visualized (advanced GA >33wks)

Uterus
No abnormality visualized.

Left Ovary
Not visualized.

Right Ovary
Not visualized.

Adnexa:       No abnormality visualized. No adnexal mass
visualized.
Impression

Singleton intrauterine pregnancy at 32+2 weeks with AMA,
chronic hepatitis B, thyroid disease and GDM here for growth
evaluation
Interval review of the anatomy shows no sonographic
markers for aneuploidy or structural anomalies
All relevant fetal anatomy has been visualized
Amniotic fluid volume is normal
Estimated fetal weight shows growth in the 71st percentile
Recommendations

Recommend follow-up ultrasound examination in 4 weeks for
final evaluation of growth

## 2019-05-12 ENCOUNTER — Other Ambulatory Visit: Payer: Self-pay

## 2019-05-12 ENCOUNTER — Ambulatory Visit (INDEPENDENT_AMBULATORY_CARE_PROVIDER_SITE_OTHER): Payer: Medicaid Other | Admitting: Advanced Practice Midwife

## 2019-05-12 ENCOUNTER — Encounter: Payer: Self-pay | Admitting: Advanced Practice Midwife

## 2019-05-12 VITALS — BP 102/67 | HR 68 | Ht 64.0 in | Wt 147.4 lb

## 2019-05-12 DIAGNOSIS — Z3040 Encounter for surveillance of contraceptives, unspecified: Secondary | ICD-10-CM

## 2019-05-12 DIAGNOSIS — Z30430 Encounter for insertion of intrauterine contraceptive device: Secondary | ICD-10-CM | POA: Diagnosis not present

## 2019-05-12 DIAGNOSIS — Z01419 Encounter for gynecological examination (general) (routine) without abnormal findings: Secondary | ICD-10-CM

## 2019-05-12 MED ORDER — PARAGARD INTRAUTERINE COPPER IU IUD: INTRAUTERINE_SYSTEM | Freq: Once | INTRAUTERINE | Status: AC

## 2019-05-12 NOTE — Progress Notes (Signed)
    Post Partum Visit Note  Michelle Logan is a 39 y.o. G64P3003 female who presents for a postpartum visit. She is 10 weeks postpartum following a normal spontaneous vaginal delivery.  I have fully reviewed the prenatal and intrapartum course. The delivery was at 38/3 gestational weeks.  Anesthesia: none. Postpartum course has been uncomplicated. Baby is doing well. Baby is feeding by breast. Bleeding no bleeding. Bowel function is normal. Bladder function is normal. Patient is not sexually active. Contraception method is none. Postpartum depression screening: negative.  The following portions of the patient's history were reviewed and updated as appropriate: allergies, current medications, past family history, past medical history, past social history, past surgical history and problem list.  Review of Systems A comprehensive review of systems was negative.    Objective:  unknown if currently breastfeeding.  General:  alert, cooperative and appears stated age   Breasts:  negative  Lungs: clear to auscultation bilaterally  Heart:  regular rate and rhythm, S1, S2 normal, no murmur, click, rub or gallop  Abdomen: soft, non-tender; bowel sounds normal; no masses,  no organomegaly   Vulva:  normal  Vagina: normal vagina, no discharge, exudate, lesion, or erythema  Cervix:  no lesions  Corpus: not examined  Adnexa:  not evaluated  Rectal Exam: Not performed.        Assessment:    Normal postpartum exam. Pap smear not performed at today's visit.   Plan:   Essential components of care per ACOG recommendations:  1.  Mood and well being: Patient with negative depression screening today. Reviewed local resources for support.  - Patient does not use tobacco.  - hx of drug use? No    2. Infant care and feeding:  -Patient currently breastmilk feeding? Yes   3. Sexuality, contraception and birth spacing - Patient does not want a pregnancy in the next year.   4. Sleep and  fatigue -Encouraged family/partner/community support of 4 hrs of uninterrupted sleep to help with mood and fatigue  5. Physical Recovery  - Discussed patients delivery and complications - Patient has urinary incontinence? No - Patient is safe to resume physical and sexual activity  6.  Health Maintenance - Last pap smear done 08/2018 and was normal with negative HPV.  Clayton Bibles, MSN, CNM Certified Nurse Midwife, [employer] for [employer], [facility] 05/12/19 9:10 PM

## 2019-05-12 NOTE — Patient Instructions (Signed)

## 2019-05-12 NOTE — Progress Notes (Signed)
    GYNECOLOGY OFFICE PROCEDURE NOTE  Michelle Logan is a 39 y.o. G8B1694 here for Paragard IUD insertion. Patient had same placed postplacentally and reports that it dislodged prior to her hospital discharge. No GYN concerns.    IUD Insertion Procedure Note Patient identified, informed consent performed, consent signed.   Discussed risks of irregular bleeding, cramping, infection, malpositioning or misplacement of the IUD outside the uterus which may require further procedure such as laparoscopy. Also discussed >99% contraception efficacy, increased risk of ectopic pregnancy with failure of method.  Time out was performed.  Urine pregnancy test negative.  Speculum placed in the vagina.  Cervix visualized.  Cleaned with Betadine x 2.  Grasped anteriorly with a single tooth tenaculum.  Paragard IUD placed per manufacturer's recommendations.  Strings trimmed to 3 cm. Tenaculum was removed, good hemostasis noted.  Patient tolerated procedure well.   Patient was given post-procedure instructions.  She was advised to have backup contraception for one week.  Patient was also asked to check IUD strings periodically and follow up in 4 weeks for IUD check.  Clayton Bibles, MSN, CNM Certified Nurse Midwife, [employer] for [employer], [facility] 05/12/19 9:12 PM

## 2019-06-09 ENCOUNTER — Encounter: Payer: Self-pay | Admitting: Medical

## 2019-06-09 ENCOUNTER — Other Ambulatory Visit: Payer: Self-pay

## 2019-06-09 ENCOUNTER — Ambulatory Visit (INDEPENDENT_AMBULATORY_CARE_PROVIDER_SITE_OTHER): Payer: Medicaid Other | Admitting: Medical

## 2019-06-09 VITALS — BP 98/65 | HR 70 | Ht 64.0 in | Wt 146.1 lb

## 2019-06-09 DIAGNOSIS — Z30431 Encounter for routine checking of intrauterine contraceptive device: Secondary | ICD-10-CM

## 2019-06-09 NOTE — Progress Notes (Signed)
  History:  [name] is a 39 y.o. [phone] who presents to clinic today for IUD string check. IUD was placed on 5/5 by Thalia Bloodgood, CNM at the [facility]. She denies pain or bleeding today. She has noted occasional mild cramping since insertion. She states 4 days of very light bleeding following insertion.  The following portions of the patient's history were reviewed and updated as appropriate: allergies, current medications, family history, past medical history, social history, past surgical history and problem list.  Review of Systems:  Review of Systems  Gastrointestinal: Negative for abdominal pain.  Genitourinary:       Neg - vaginal bleeding, discharge      Objective:  Physical Exam BP 98/65   Pulse 70   Ht 5\' 4"  (1.626 m)   Wt 146 lb 1.6 oz (66.3 kg)   LMP 05/12/2019 (Approximate)   BMI 25.08 kg/m  Physical Exam  Nursing note and vitals reviewed. Constitutional: She is oriented to person, place, and time. She appears well-developed and well-nourished. No distress.  HENT:  Head: Normocephalic and atraumatic.  Cardiovascular: Normal rate.  Respiratory: Effort normal.  GI: Soft. She exhibits no distension.  Genitourinary: Cervix exhibits no friability.    Vaginal discharge (small, mucous) present.     No vaginal bleeding.  No bleeding in the vagina.    Genitourinary Comments: IUD strings are visualized and appear appropriate length   Neurological: She is alert and oriented to person, place, and time.  Skin: Skin is warm and dry. No erythema.  Psychiatric: She has a normal mood and affect.    Assessment & Plan:   Encounter Diagnosis  Name Primary?  . IUD check up Yes   Patient counseled on reasons for return   Approximately 10 minutes of total time was spent with this patient   07/12/2019 06/09/2019 4:10 PM

## 2019-06-09 NOTE — Patient Instructions (Signed)

## 2019-09-16 ENCOUNTER — Encounter: Payer: Self-pay | Admitting: General Practice

## 2019-09-21 ENCOUNTER — Encounter: Payer: Self-pay | Admitting: Infectious Disease

## 2019-09-21 ENCOUNTER — Ambulatory Visit (INDEPENDENT_AMBULATORY_CARE_PROVIDER_SITE_OTHER): Payer: Medicaid Other | Admitting: Infectious Disease

## 2019-09-21 ENCOUNTER — Other Ambulatory Visit: Payer: Self-pay

## 2019-09-21 VITALS — BP 101/69 | HR 92 | Temp 98.4°F | Wt 150.0 lb

## 2019-09-21 DIAGNOSIS — B181 Chronic viral hepatitis B without delta-agent: Secondary | ICD-10-CM | POA: Diagnosis not present

## 2019-09-21 NOTE — Progress Notes (Signed)
Subjective:   Chief complaint: followup for otitis B infection    Patient ID: Michelle Logan, female    DOB: 05-29-1980, 39 y.o.   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  She just delivered another baby recently.  She was also seen by Rexene Alberts in the interim who gave her flu shot last year as well as Prevnar 13.  The patient claims that she had a bad rash which she attributes to the Prevnar and does not want to have Pneumovax vaccine today when offered.  She otherwise is doing well and has had her COVID-19 vaccinations.    Past Medical History:  Diagnosis Date  . Chronic hepatitis B without delta agent without hepatic coma (HCC) 12/21/2014  . Pregnancy and infectious disease 12/21/2014  . Thyroid disease during pregnancy 12/21/2014  . UTI (urinary tract infection) 12/21/2014    Past Surgical History:  Procedure Laterality Date  . NO PAST SURGERIES      No family history on file.    Social History   Socioeconomic History  . Marital status: Married    Spouse name: Not on file  . Number of children: Not on file  . Years of education: Not on file  . Highest education level: Not on file  Occupational History  . Not on file  Tobacco Use  . Smoking status: Never Smoker  . Smokeless tobacco: Never Used  Vaping Use  . Vaping Use: Never used  Substance and Sexual Activity  . Alcohol use: No    Alcohol/week: 0.0 standard drinks  . Drug use: No  . Sexual activity: Yes    Partners: Male    Birth control/protection: None  Other Topics Concern  . Not on file  Social History Narrative  . Not on file   Social Determinants of Health   Financial Resource Strain:   . Difficulty of Paying Living Expenses: Not on file  Food Insecurity: No Food Insecurity  . Worried About Programme researcher, broadcasting/film/video in the Last Year: Never true    . Ran Out of Food in the Last Year: Never true  Transportation Needs: No Transportation Needs  . Lack of Transportation (Medical): No  . Lack of Transportation (Non-Medical): No  Physical Activity:   . Days of Exercise per Week: Not on file  . Minutes of Exercise per Session: Not on file  Stress:   . Feeling of Stress : Not on file  Social Connections:   . Frequency of Communication with Friends and Family: Not on file  . Frequency of Social Gatherings with Friends and Family: Not on file  . Attends Religious Services: Not on file  . Active Member of Clubs or Organizations: Not on file  . Attends Banker Meetings: Not on file  . Marital Status: Not on file    No Known Allergies   Current Outpatient Medications:  .  acetaminophen (TYLENOL) 325 MG tablet, Take 2 tablets (650 mg total) by mouth every 4 (four) hours as needed (for pain scale < 4)., Disp: 30 tablet, Rfl: 0 .  ibuprofen (ADVIL) 600 MG tablet, Take 1 tablet (600 mg total) by mouth every 6 (six) hours., Disp: 30 tablet, Rfl: 0 .  Prenat-Fe Poly-Methfol-FA-DHA (VITAFOL ULTRA) 29-0.6-0.4-200 MG CAPS, TAKE 1 CAPSULE BY MOUTH DAILY, Disp: 30 capsule, Rfl: 12 .  Blood Pressure Monitoring (  BLOOD PRESSURE KIT) DEVI, 1 Device by Does not apply route as needed. ICD 10:  O09.90 (Patient not taking: Reported on 09/21/2019), Disp: 1 Device, Rfl: 0 .  calcium carbonate (TUMS - DOSED IN MG ELEMENTAL CALCIUM) 500 MG chewable tablet, Chew 1 tablet by mouth 2 (two) times daily as needed for indigestion or heartburn. (Patient not taking: Reported on 09/21/2019), Disp: , Rfl:    Review of Systems  Constitutional: Negative for chills and fever.  HENT: Negative for congestion and sore throat.   Eyes: Negative for photophobia.  Respiratory: Negative for cough, shortness of breath and wheezing.   Cardiovascular: Negative for chest pain, palpitations and leg swelling.  Gastrointestinal: Negative for abdominal pain, blood in stool,  constipation, diarrhea, nausea and vomiting.  Genitourinary: Negative for dysuria, flank pain and hematuria.  Musculoskeletal: Negative for back pain and myalgias.  Skin: Negative for rash.  Neurological: Negative for dizziness, weakness and headaches.  Hematological: Does not bruise/bleed easily.  Psychiatric/Behavioral: Negative for agitation, behavioral problems, confusion, decreased concentration, dysphoric mood, hallucinations and suicidal ideas. The patient is not hyperactive.        Objective:   Physical Exam Constitutional:      General: She is not in acute distress.    Appearance: She is not diaphoretic.  HENT:     Head: Normocephalic and atraumatic.     Right Ear: External ear normal.     Left Ear: External ear normal.     Nose: Nose normal.     Mouth/Throat:     Pharynx: No oropharyngeal exudate.  Eyes:     General: No scleral icterus.    Conjunctiva/sclera: Conjunctivae normal.     Pupils: Pupils are equal, round, and reactive to light.  Cardiovascular:     Rate and Rhythm: Normal rate and regular rhythm.  Pulmonary:     Effort: Pulmonary effort is normal. No respiratory distress.     Breath sounds: Normal breath sounds. No wheezing.  Abdominal:     General: There is no distension.     Palpations: Abdomen is soft.  Musculoskeletal:        General: No tenderness. Normal range of motion.     Cervical back: Normal range of motion and neck supple.  Lymphadenopathy:     Cervical: No cervical adenopathy.  Skin:    General: Skin is warm and dry.     Coloration: Skin is not pale.     Findings: No erythema or rash.  Neurological:     General: No focal deficit present.     Mental Status: She is alert and oriented to person, place, and time.     Coordination: Coordination normal.  Psychiatric:        Mood and Affect: Mood normal.        Behavior: Behavior normal.        Thought Content: Thought content normal.        Judgment: Judgment normal.             Assessment & Plan:   Chronic hepatitis b without delta agent:  ---check LFTS and HBV DNA, check delta agents as I do not see this was done  She refused Pneumovax vaccine  Return to clinic in 1 year

## 2019-09-25 LAB — COMPLETE METABOLIC PANEL WITH GFR
AG Ratio: 1.7 (calc) (ref 1.0–2.5)
ALT: 16 U/L (ref 6–29)
AST: 18 U/L (ref 10–30)
Albumin: 4.5 g/dL (ref 3.6–5.1)
Alkaline phosphatase (APISO): 85 U/L (ref 31–125)
BUN: 14 mg/dL (ref 7–25)
CO2: 27 mmol/L (ref 20–32)
Calcium: 10 mg/dL (ref 8.6–10.2)
Chloride: 104 mmol/L (ref 98–110)
Creat: 0.7 mg/dL (ref 0.50–1.10)
GFR, Est African American: 128 mL/min/{1.73_m2} (ref >=60)
GFR, Est Non African American: 111 mL/min/{1.73_m2} (ref >=60)
Globulin: 2.7 g/dL (calc) (ref 1.9–3.7)
Glucose, Bld: 80 mg/dL (ref 65–99)
Potassium: 4.3 mmol/L (ref 3.5–5.3)
Sodium: 140 mmol/L (ref 135–146)
Total Bilirubin: 0.4 mg/dL (ref 0.2–1.2)
Total Protein: 7.2 g/dL (ref 6.1–8.1)

## 2019-09-25 LAB — HEPATITIS B DNA, ULTRAQUANTITATIVE, PCR
Hepatitis B DNA (Calc): 3.4 Log IU/mL — ABNORMAL HIGH
Hepatitis B DNA: 2520 IU/mL — ABNORMAL HIGH

## 2019-09-25 LAB — HEPATITIS DELTA VIRUS ANTIGEN: Hepatitis D Antigen: NOT DETECTED

## 2020-02-10 ENCOUNTER — Other Ambulatory Visit: Payer: Self-pay | Admitting: Advanced Practice Midwife

## 2020-02-10 DIAGNOSIS — O0990 Supervision of high risk pregnancy, unspecified, unspecified trimester: Secondary | ICD-10-CM

## 2020-08-31 ENCOUNTER — Other Ambulatory Visit: Payer: Self-pay

## 2020-08-31 ENCOUNTER — Encounter: Payer: Self-pay | Admitting: Obstetrics and Gynecology

## 2020-08-31 ENCOUNTER — Ambulatory Visit (INDEPENDENT_AMBULATORY_CARE_PROVIDER_SITE_OTHER): Payer: Medicaid Other | Admitting: Obstetrics and Gynecology

## 2020-08-31 VITALS — BP 105/62 | HR 76 | Ht 64.0 in | Wt 163.5 lb

## 2020-08-31 DIAGNOSIS — Z30432 Encounter for removal of intrauterine contraceptive device: Secondary | ICD-10-CM

## 2020-08-31 NOTE — Progress Notes (Signed)
IUD Removal   Patient was in the dorsal lithotomy position, normal external genitalia was noted.  A speculum was placed in the patient's vagina, normal discharge was noted, no lesions. The multiparous cervix was visualized, no lesions, no abnormal discharge.  The strings of the IUD were grasped and pulled using ring forceps. The IUD was removed in its entirety. Patient tolerated the procedure well.     Patient declines further BC at this time.   Duane Lope, NP 08/31/2020 10:31 AM

## 2020-08-31 NOTE — Patient Instructions (Signed)
AREA PEDIATRIC/FAMILY PRACTICE PHYSICIANS  [place] ([address]) [facility] Chambliss, MD; Eniola, MD; Hale, MD; Hensel, MD; McDiarmid, MD; McIntyer, MD; Neal, MD; Walden, MD [address] [place], [place] [address] [phone] Mon-Fri 8:30-12:30, 1:30-5:00 Providers come to see babies at [facility] Accepting Medicaid [facility] providers who accept newborns: Koirala, MD; Morrow, MD; Wolters, MD [address], [place], [place] [address] [phone] Mon-Fri 8:00-5:30 Babies seen by providers at [facility] Does NOT accept Medicaid Please call early in hospitalization for appointment (limited availability)  [facility] Mulberry, MD [address] [place], [place] [address] [phone] Mon, Tue, Thur, Fri 8:30-5:00, Wed 10:00-7:00 (closed 1-2pm) Babies seen by [facility] providers Accepting Medicaid Rubin - Pediatrician Rubin, MD [address], [place], [place] [address] [phone] Mon-Fri 8:30-5:00, Sat 8:30-12:00 Provider comes to see babies at [facility] Accepting Medicaid Must have been referred from current patients or contacted office prior to delivery [facility] ([facility]) Brown, MD; Chandler, MD; Ettefagh, MD; Grant, MD; Lester, MD; McCormick, MD; McQueen, MD; Prose, MD; Simha, MD; Stanley, MD; Stryffeler, NP; Tebben, NP [address], [place], [place] [address] [phone] Mon, Tue, Thur, Fri 8:30-5:30, Wed 9:30-5:30, Sat 8:30-12:30 Babies seen by [facility] providers Accepting Medicaid Only accepting infants of first-time parents or siblings of current patients Hospital discharge coordinator will make follow-up appointment Jack Amos [address], [place], [place]  [address] [phone]   Fax - [phone] [facility] [address], [place], [place]  [address] Phone - [phone]   Fax - [phone] Shilpa Gosrani [address], [place], [place]  [address] [phone]  [place] ([address]) [facility] Bates, MD; Brassfield, MD; Cooper, Cox, MD; MD; Davis, MD; Dovico, MD; Ettefaugh, MD; Little, MD; Lowe, MD; Keiffer, MD; Melvin, MD; Sumner, MD; Williams, MD [address], [place], [place] [address] [phone] Mon-Fri 8:30-5:00 (extended evenings Mon-Thur as needed), Sat-Sun 10:00-1:00 Providers come to see babies at [facility] Accepting Medicaid for families of first-time babies and families with all children in the household age 3 and under. Must register with office prior to making appointment (M-F only). [facility] Henson, NP; Knapp, MD; Lalonde, MD; Tysinger, PA [address] [place], [place] [address] [phone] Mon-Fri 8:00-5:00 Babies seen by providers at [facility] Does NOT accept Medicaid/Commercial Insurance Only [facility] ([facility])  Artis, MD; Barnes, MD; Bratton, MD; Coccaro, MD; Lockett Gardner, MD; Kramer, MD; Marshall, MD; Netherton, MD; Poleto, MD; Skinner, MD [address] [place], [place] [address] [phone] Mon-Fri 8:30-5:30, Sat (Oct.-Mar.) 9:00-1:00 Babies seen by providers at [facility] Accepting Medicaid  [place] ([address]) [facility] Reid, MD; Warner, MD [address], [place], [place] [address] [phone] Mon-Fri 8:30-5:00, Sat 8:30-12:00 Providers come to see babies at [facility] Does NOT accept Medicaid [facility] Becker, PA; Hagler, MD; Scifres, PA; Sun, MD; Swayne, MD [address], [place], [place] [address] [phone] Mon-Fri 8:00-5:00 Babies seen by providers at [facility] Does NOT accept Medicaid Only accepting babies of parents who  are patients Please call early in hospitalization for appointment (limited availability) [facility] Clark, MD; Frye, MD; Kelleher, MD; Mack, NP; Miller, MD; O'Keller, MD; Patterson, NP; Pudlo, MD; Puzio, MD; Thomas, MD; Tucker, MD; Twiselton, MD [address], [place], [place] [address] [phone] Mon-Fri 8:00-5:00, Sat 9:00-12:00 Providers come to see babies at [facility] Does NOT accept Medicaid  [place] ([address]) [facility] providers accepting new patients: Brake, NP; Wharton, PA [address], [place], [place] [address] [phone] Mon-Fri 8:00-5:00 Babies seen by providers at [facility] Does NOT accept Medicaid Only accepting babies of parents who are patients Please call early in hospitalization for appointment (limited availability) [facility] Gay, MD; Quinlan, MD [address] [place], [place] [address] [phone] (press 1 to schedule appointment) Mon-Fri 8:00-5:00 Providers come to see babies at [facility] Does NOT accept Medicaid [facility] Mazer, MD [address] [place], [place] [address] [phone] Mon-Fri 8:30-5:00 (lunch 12:30-1:00), extended hours by appointment only Wed 5:00-6:30 Babies seen by [facility] providers Accepting Medicaid [facility] Banks, MD; Jordan, MD; Koberlein, MD [address], [place], [place] [address] [phone] Mon-Fri 8:00-5:00 Babies seen by [facility] providers Does NOT accept Medicaid [facility] Parker, MD; Hunter, MD; Wallace, DO [address] [place], [place] [address] [phone] Mon-Fri 8:00-5:00 Babies seen by [facility] providers Does NOT accept Medicaid [facility] Brandon, PA; Brecken, PA; Christy, NP; Dees, MD; DeClaire, MD; DeWeese, MD; Hansen, NP; Mills, NP; Parrish, NP; Smoot, NP; Summer, MD; Vapne,  MD [address] [place], [place] [address] [phone] Mon-Fri 8:30-5:00, Sat 10:00-1:00 Providers come to see babies at [facility] Does NOT accept Medicaid Free prenatal information session Tuesdays at 4:45pm [facility] Bouska, MD; Gordon, PA; Jeffery, PA; Weber, PA [address] [place] [address] [phone] Mon-Fri 7:30-5:30 Babies seen by [facility] providers [facility] [address], [place], [place]  [address] [phone]   Fax - [phone]  [place] ([address] & [address]) [facility] [place] [address] [place], [place] [address] [phone] Mon-Thur 8:00-6:00 Providers come to see babies at [facility] Accepting Medicaid [facility] Anderson, NP; Badger, MD; Beal, PA; Spencer, PA [address] [place], [place] [address] [phone] Mon-Thur 7:30-7:30, Fri 7:30-4:30 Babies seen by [facility] providers Accepting Medicaid [facility] Agbuya, MD; Klett, NP; Romgoolam, MD [address], [place], [place] [address] [phone] Mon-Fri 8:30-5:00, Sat 8:30-12:00 Providers come to see babies at [facility] Accepting Medicaid Must have "Meet & Greet" appointment at office prior to delivery [facility]) McCord, MD; Wallace, MD; Wood, MD [address], [place], [place] [address] [phone] Mon-Wed 8:00-6:00, Thur-Fri 8:00-5:00, Sat 9:00-12:00 Providers come to see babies at [facility] Does NOT accept Medicaid Only accepting siblings of current patients [facility]  [address], [place], [place]  [address] [phone]   Fax - [phone] [facility] [address], [place], [place]  [address] [phone]   Fax -  [phone]  [place] ([address] & [address])  [facility] Cirigliano, DO; Matthews, DO [address] [place], [place] [address] [phone] Mon-Fri 7:00-5:00 Babies seen by [facility] providers Does NOT accept Medicaid [facility] Briscoe, MD; Howley, PA; Moreira, PA [address], [place],  [address] [phone] Mon-Fri 8:00-5:00 Babies seen by [facility] providers Accepting Medicaid [facility] Boyd, MD; Church, PA; Jones, NP; Osborn, PA [address], ,  [address] [phone] Mon-Fri 8:00-5:00 Babies seen by providers at [facility] Accepting Medicaid  [place] ([address]) [facility] [place] Wendling, DO [address] [place], [place] [address] [phone] Mon-Fri 8:00-5:00 Babies seen by [facility] providers Does NOT accept Medicaid Limited availability, please call early in hospitalization to schedule follow-up [facility] Calderon, PA; Cummings, MD; Dillard, MD; Martin, PA; Olson, MD; VanDeven, PA [address], [place], [place] [address] [phone] Mon-Fri 8:30-5:00, Sat 9:00-12:00 Babies seen by providers at [facility] Accepting Medicaid Please register online then schedule online or call office [email or web address] Hunter, NP; Kumar, MD; Martin Rogers, PA [address], [place], [place] [address] [phone] Mon-Fri 8:00-5:00 Babies seen by providers at [facility] Accepting Medicaid [facility] ([facility]) [place], MD; Kristi Fleenor, NP; West, MD [address], [place], [place] [address] [phone] Mon-Fri 8:00-5:30, Sat&Sun by appointment (phones open at  8:30) Babies seen by [facility] providers Accepting Medicaid Must be a first-time baby or sibling of current patient [facility] [place]  [address], [place], [place]  [address] [phone]   Fax - [phone]  [place] ([address]) [facility] Brown, PA; Cowen, PA; Rice, MD; Helton, PA; Spry, MD [address] [place], [place] [address] [phone] Mon-Thur 8:00-7:00, Fri 8:00-5:00, Sat 8:00-12:00, Sun 9:00-12:00 Babies seen by [facility] providers Accepting Medicaid [facility] Coe-Goins, MD; Marshall, MD; Pierre-Louis, MD [address], [place], [place] [address] [phone] Mon-Thur 8:00-5:00 Babies seen by providers at [facility] Accepting Medicaid [facility] Bratton, MD; Coe-Goins, MD; Hayes, MD; Lewis, MD; List, MD; Lott, MD; Marshall, MD; Moran, MD; O'Neal, MD; Pierre-Louis, MD; Pitonzo, MD; Scholer, MD; Spangle, MD [address] [place], [place] [address] [phone] Mon-Fri 8:00-5:30, Sat (Oct.-Mar.) 9:00-1:00 Babies seen by providers at [facility] Accepting Medicaid Must fill out new patient packet, available online at [email or web address] [facility] ([facility]) Friddle, NP; Harris, NP; Kelly, NP; Logan, MD; Melvin, PA; Poth, MD; Ramadoss, MD; [place], NP [address], [place], [place] [address] [phone] Mon-Thur 8:00-5:30, Fri 8:00-5:00 Babies seen by providers at [facility] Accepting Medicaid  [place] ([address]) [facility] Dixon, PA; Amoret, MD; Pickard, MD; Tapia, PA [address], [place], [place] [address] [phone] Mon-Fri 8:00-5:00 Babies seen by providers at [facility] Accepting Medicaid   [place] ([address]) [facility] [place] Masneri, DO; Meyers, MD; Nelson, PA [address], [place], [place] [address] [phone] Mon-Fri 8:00-5:00 Babies seen by providers at [facility] Does NOT accept Medicaid Limited appointment availability, please call early in hospitalization  [facility] [place] Kunedd, DO; McGowen, MD [address], [place], [place] [address] [phone] Mon-Fri 8:00-5:00 Babies seen by [facility] providers Does NOT accept Medicaid [facility] [place] Cameron, MD; MacDonald, MD; Michaels, PA; Nayak, MD [address] [place] [address], [place], [place] [address] [phone] Mon-Fri 8:00-5:00 After hours clinic ([address] [place],  [address]) [phone] Mon-Fri 5:00-8:00, Sat 12:00-6:00, Sun 10:00-4:00 Babies seen by [facility] providers Accepting Medicaid [facility] [place] [address], [place], [place]  [address] [phone]   Fax - [phone]  [place] ([address]) [facility] [place] [facility] Andy, MD [address], [place], [place] [address] [phone] Mon-Fri 8:00-5:00 Babies seen by [facility] providers Does NOT accept Medicaid [facility] [place] ([facility] [place]) Eksir, MD [address], [place], [place] [address] [phone] Mon-Thur 8:00-7:00, Fri 8:00-5:00, Sat 8:00-12:00 Babies seen by providers at [facility] Accepting Medicaid - but does not have vaccinations in office (must be received elsewhere) Limited availability, please call early in hospitalization  [place] ([address]) [facility]  Charlene Flemming, MD [address], [place] [address] [phone]  Fax [phone]  [facility]  Kimberly Newton, MD, Annamarie Streilein, PA, Carla Hampton, PA [address] [place], [place]  [address] [phone] [facility]  [address], [place], [place] [address] [phone] [address], [place], [place] [address] [phone] (West Office)  [facility] [address], [place], [place] [address] [phone] [facility] [address], [place], [place] [address] [phone] [facility] [address], [place], [place] [address] [phone] [facility] [address], [place], [place] [address] [phone] [facility] [address], [place], [place] [address] [phone] [facility] [address], [place], [place] [address] [phone] [facility]  [address], [place], [place] [address] [phone] Dr[facility] [address] [place], , [place] [address] [phone] [facility] [address], [place], [place] [address] [phone] [facility] [address], ,  [address] [phone]  

## 2020-09-20 ENCOUNTER — Ambulatory Visit: Payer: Medicaid Other | Admitting: Infectious Disease

## 2020-10-02 ENCOUNTER — Ambulatory Visit: Payer: Medicaid Other | Admitting: Infectious Disease

## 2020-10-09 ENCOUNTER — Ambulatory Visit: Payer: Medicaid Other | Admitting: Infectious Disease

## 2022-05-22 ENCOUNTER — Ambulatory Visit: Payer: Medicaid Other | Admitting: Family Medicine

## 2022-06-20 ENCOUNTER — Encounter: Payer: Self-pay | Admitting: Family Medicine

## 2022-06-20 ENCOUNTER — Ambulatory Visit: Payer: Medicaid Other | Admitting: Family Medicine

## 2022-06-20 VITALS — BP 100/72 | HR 76 | Temp 97.6°F | Ht 64.0 in | Wt 177.0 lb

## 2022-06-20 DIAGNOSIS — Z7689 Persons encountering health services in other specified circumstances: Secondary | ICD-10-CM

## 2022-06-20 DIAGNOSIS — E079 Disorder of thyroid, unspecified: Secondary | ICD-10-CM

## 2022-06-20 DIAGNOSIS — Z6830 Body mass index (BMI) 30.0-30.9, adult: Secondary | ICD-10-CM

## 2022-06-20 DIAGNOSIS — Z111 Encounter for screening for respiratory tuberculosis: Secondary | ICD-10-CM | POA: Diagnosis not present

## 2022-06-20 DIAGNOSIS — B181 Chronic viral hepatitis B without delta-agent: Secondary | ICD-10-CM | POA: Diagnosis not present

## 2022-06-20 DIAGNOSIS — E041 Nontoxic single thyroid nodule: Secondary | ICD-10-CM | POA: Diagnosis not present

## 2022-06-20 DIAGNOSIS — Z22330 Carrier of Group B streptococcus: Secondary | ICD-10-CM | POA: Insufficient documentation

## 2022-06-20 DIAGNOSIS — Z0000 Encounter for general adult medical examination without abnormal findings: Secondary | ICD-10-CM

## 2022-06-20 DIAGNOSIS — E669 Obesity, unspecified: Secondary | ICD-10-CM

## 2022-06-20 DIAGNOSIS — Z0001 Encounter for general adult medical examination with abnormal findings: Secondary | ICD-10-CM

## 2022-06-20 DIAGNOSIS — Z23 Encounter for immunization: Secondary | ICD-10-CM

## 2022-06-20 LAB — CBC WITH DIFFERENTIAL/PLATELET
Basophils Absolute: 0 10*3/uL (ref 0.0–0.1)
Basophils Relative: 0.3 % (ref 0.0–3.0)
Eosinophils Absolute: 0.1 10*3/uL (ref 0.0–0.7)
Eosinophils Relative: 1 % (ref 0.0–5.0)
HCT: 39.8 % (ref 36.0–46.0)
Hemoglobin: 12.8 g/dL (ref 12.0–15.0)
Lymphocytes Relative: 46.6 % — ABNORMAL HIGH (ref 12.0–46.0)
Lymphs Abs: 2.5 10*3/uL (ref 0.7–4.0)
MCHC: 32.2 g/dL (ref 30.0–36.0)
MCV: 86.5 fl (ref 78.0–100.0)
Monocytes Absolute: 0.6 10*3/uL (ref 0.1–1.0)
Monocytes Relative: 11.7 % (ref 3.0–12.0)
Neutro Abs: 2.1 10*3/uL (ref 1.4–7.7)
Neutrophils Relative %: 40.4 % — ABNORMAL LOW (ref 43.0–77.0)
Platelets: 195 10*3/uL (ref 150.0–400.0)
RBC: 4.6 Mil/uL (ref 3.87–5.11)
RDW: 14.7 % (ref 11.5–15.5)
WBC: 5.3 10*3/uL (ref 4.0–10.5)

## 2022-06-20 LAB — COMPREHENSIVE METABOLIC PANEL
ALT: 15 U/L (ref 0–35)
AST: 19 U/L (ref 0–37)
Albumin: 4.2 g/dL (ref 3.5–5.2)
Alkaline Phosphatase: 53 U/L (ref 39–117)
BUN: 13 mg/dL (ref 6–23)
CO2: 29 mEq/L (ref 19–32)
Calcium: 9.3 mg/dL (ref 8.4–10.5)
Chloride: 102 mEq/L (ref 96–112)
Creatinine, Ser: 0.7 mg/dL (ref 0.40–1.20)
GFR: 108.37 mL/min (ref >60.00)
Glucose, Bld: 93 mg/dL (ref 70–99)
Potassium: 3.9 mEq/L (ref 3.5–5.1)
Sodium: 137 mEq/L (ref 135–145)
Total Bilirubin: 0.4 mg/dL (ref 0.2–1.2)
Total Protein: 7.3 g/dL (ref 6.0–8.3)

## 2022-06-20 LAB — LIPID PANEL
Cholesterol: 194 mg/dL (ref 0–200)
HDL: 72.6 mg/dL (ref >39.00)
LDL Cholesterol: 110 mg/dL — ABNORMAL HIGH (ref 0–99)
NonHDL: 120.97
Total CHOL/HDL Ratio: 3
Triglycerides: 55 mg/dL (ref 0.0–149.0)
VLDL: 11 mg/dL (ref 0.0–40.0)

## 2022-06-20 LAB — TSH: TSH: 0.58 u[IU]/mL (ref 0.35–5.50)

## 2022-06-20 LAB — T4, FREE: Free T4: 0.79 ng/dL (ref 0.60–1.60)

## 2022-06-20 NOTE — Assessment & Plan Note (Signed)
Needed for nursing program.

## 2022-06-20 NOTE — Assessment & Plan Note (Signed)
Recommend healthy diet and exercise. 

## 2022-06-20 NOTE — Patient Instructions (Signed)
Thank you for trusting [place] with your health care.  Please go downstairs for labs before you leave today.  Please call and schedule with your OB/GYN.  You are due for your mammogram and Pap smear.  Please call and schedule with infectious disease to follow-up on chronic hepatitis B.  We will be in touch with your results.

## 2022-06-20 NOTE — Progress Notes (Signed)
New Patient Office Visit  Subjective    Patient ID: Michelle Logan, female    DOB: [birth year]  Age: 42 y.o. MRN: [id number]  CC:  Chief Complaint  Patient presents with   Establish Care    No concerns. Hasn't had PCP for a while    HPI Michelle Logan presents to establish care and for a fasting CPE.  No PCP in 2 years.   Last child 3 years ago.  [facility]   Thyroid disorder and right thyroid nodule in past. No issues swallowing or neck stiffness.   Chronic hep B- she was seeing ID   Applying for masters in nursing and needs forms filled out.  Needs TB screening.  Overdue for Tdap.   3 children and married.  Works as a Engineer, civil (consulting) with [employer].   She reports being UTD with dentist. Overdue for eye exam.   Outpatient Encounter Medications as of 06/20/2022  Medication Sig   Prenatal Vit-Fe Fumarate-FA (PRENATAL VITAMIN PO) Take by mouth.   [DISCONTINUED] acetaminophen (TYLENOL) 325 MG tablet Take 2 tablets (650 mg total) by mouth every 4 (four) hours as needed (for pain scale < 4).   [DISCONTINUED] Blood Pressure Monitoring (BLOOD PRESSURE KIT) DEVI 1 Device by Does not apply route as needed. ICD 10:  O09.90 (Patient not taking: No sig reported)   [DISCONTINUED] calcium carbonate (TUMS - DOSED IN MG ELEMENTAL CALCIUM) 500 MG chewable tablet Chew 1 tablet by mouth 2 (two) times daily as needed for indigestion or heartburn. (Patient not taking: No sig reported)   [DISCONTINUED] ibuprofen (ADVIL) 600 MG tablet Take 1 tablet (600 mg total) by mouth every 6 (six) hours.   [DISCONTINUED] Prenat-Fe Poly-Methfol-FA-DHA (VITAFOL ULTRA) 29-0.6-0.4-200 MG CAPS TAKE 1 CAPSULE BY MOUTH DAILY (Patient not taking: Reported on 08/31/2020)   No facility-administered encounter medications on file as of 06/20/2022.    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 No pertinent family history.  Social History   Socioeconomic History   Marital status: Married    Spouse name: Not on file   Number of children: Not on file   Years of education: Not on file   Highest education level: Not on file  Occupational History   Not on file  Tobacco Use   Smoking status: Never   Smokeless tobacco: Never  Vaping Use   Vaping Use: Never used  Substance and Sexual Activity   Alcohol use: No    Alcohol/week: 0.0 standard drinks of alcohol   Drug use: No   Sexual activity: Yes    Partners: Male    Birth control/protection: None  Other Topics Concern   Not on file  Social History Narrative   Not on file   Social Determinants of Health   Financial Resource Strain: Not on file  Food Insecurity: No Food Insecurity (06/09/2019)   Hunger Vital Sign    Worried About Running Out of Food in the Last Year: Never true    Ran Out of Food in the Last Year: Never true  Transportation Needs: No Transportation Needs (06/09/2019)   PRAPARE - Transportation    Lack of Transportation (Medical): No    Lack  of Transportation (Non-Medical): No  Physical Activity: Not on file  Stress: Not on file  Social Connections: Not on file  Intimate Partner Violence: Not on file    Review of Systems  Constitutional:  Negative for chills and fever.  HENT:  Negative for congestion, ear pain, sinus pain and sore throat.   Eyes:  Negative for blurred vision, double vision and pain.  Respiratory:  Negative for cough, shortness of breath and wheezing.   Cardiovascular:  Negative for chest pain, palpitations and leg swelling.  Gastrointestinal:  Negative for abdominal pain, constipation, diarrhea, nausea and vomiting.  Genitourinary:  Negative for dysuria, frequency and urgency.  Musculoskeletal:   Negative for back pain, joint pain and myalgias.  Skin:  Negative for rash.  Neurological:  Negative for dizziness, tingling, focal weakness and headaches.  Endo/Heme/Allergies:  Does not bruise/bleed easily.  Psychiatric/Behavioral:  Negative for depression and suicidal ideas. The patient is not nervous/anxious.         Objective    BP 100/72 (BP Location: Left Arm, Patient Position: Sitting, Cuff Size: Large)   Pulse 76   Temp 97.6 F (36.4 C) (Temporal)   Ht 5\' 4"  (1.626 m)   Wt 177 lb (80.3 kg)   LMP 06/14/2022   SpO2 100%   BMI 30.38 kg/m   Physical Exam Constitutional:      General: She is not in acute distress.    Appearance: She is not ill-appearing.  HENT:     Right Ear: Tympanic membrane, ear canal and external ear normal.     Left Ear: Tympanic membrane, ear canal and external ear normal.     Nose: Nose normal.     Mouth/Throat:     Mouth: Mucous membranes are moist.     Pharynx: Oropharynx is clear.  Eyes:     Extraocular Movements: Extraocular movements intact.     Conjunctiva/sclera: Conjunctivae normal.     Pupils: Pupils are equal, round, and reactive to light.  Neck:     Thyroid: Thyroid mass and thyromegaly present. No thyroid tenderness.     Vascular: No JVD.     Trachea: Trachea and phonation normal.     Comments: Right thyroid nodule Cardiovascular:     Rate and Rhythm: Normal rate and regular rhythm.     Pulses: Normal pulses.     Heart sounds: Normal heart sounds.  Pulmonary:     Effort: Pulmonary effort is normal.     Breath sounds: Normal breath sounds.  Abdominal:     General: Bowel sounds are normal.     Palpations: Abdomen is soft.     Tenderness: There is no abdominal tenderness. There is no right CVA tenderness, left CVA tenderness, guarding or rebound.  Musculoskeletal:        General: Normal range of motion.     Cervical back: Normal range of motion and neck supple. No tenderness.     Right lower leg: No edema.     Left lower  leg: No edema.  Lymphadenopathy:     Cervical: No cervical adenopathy.  Skin:    General: Skin is warm and dry.     Findings: No lesion or rash.  Neurological:     General: No focal deficit present.     Mental Status: She is alert and oriented to person, place, and time.     Cranial Nerves: No cranial nerve deficit.     Sensory: No sensory deficit.     Motor: No weakness.  Gait: Gait normal.  Psychiatric:        Mood and Affect: Mood normal.        Behavior: Behavior normal.        Thought Content: Thought content normal.         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 Nodule did not need follow up. She is asymptomatic. Continue to monitor. Check labs.       Relevant Orders   CBC with Differential/Platelet (Completed)   Comprehensive metabolic panel (Completed)   TSH (Completed)   T4, free (Completed)     Other   Encounter for general adult medical examination with abnormal findings - Primary    Preventive health care reviewed.  Counseling on healthy lifestyle including diet and exercise.  Recommend regular dental and eye exams.  Immunizations reviewed.  Discussed safety.       Obesity (BMI 30.0-34.9)    Recommend healthy diet and exercise.       Relevant Orders   CBC with Differential/Platelet (Completed)   Comprehensive metabolic panel (Completed)   Lipid panel (Completed)   Screening for tuberculosis    Needed for nursing program.       Relevant Orders   QuantiFERON-TB Gold Plus   Other Visit Diagnoses     Encounter to establish care       Immunization due       Need for diphtheria-tetanus-pertussis (Tdap) vaccine       Relevant Orders   Tdap vaccine greater than or equal to  40yo IM (Completed)      Form filled out for school. Visual acuity done and normal.   Return for pending labs.   Hetty Blend, NP-C

## 2022-06-20 NOTE — Assessment & Plan Note (Signed)
Preventive health care reviewed.  Counseling on healthy lifestyle including diet and exercise.  Recommend regular dental and eye exams.  Immunizations reviewed.  Discussed safety.  

## 2022-06-20 NOTE — Assessment & Plan Note (Signed)
She will follow up with ID

## 2022-06-20 NOTE — Assessment & Plan Note (Signed)
Reviewed thyroid US from 2019. Nodule did not need follow up. She is asymptomatic. Continue to monitor. Check labs.

## 2022-06-22 LAB — QUANTIFERON-TB GOLD PLUS
Mitogen-NIL: >10 IU/mL
NIL: 0.05 IU/mL
QuantiFERON-TB Gold Plus: NEGATIVE
TB1-NIL: 0.04 IU/mL
TB2-NIL: 0.02 IU/mL

## 2022-06-24 NOTE — Progress Notes (Signed)
Negative TB blood test and her labs are fine otherwise as well. Cut back on foods high in saturated fat, fried foods since her LDL (the bad cholesterol) is elevated slightly. We should have the form ready for her to pick up (it's on my desk) by Wednesday. She should follow up with ID as referred.

## 2022-07-02 ENCOUNTER — Telehealth: Payer: Self-pay | Admitting: Family Medicine

## 2022-07-02 NOTE — Telephone Encounter (Signed)
Patient dropped off document Hepatitis B Vaccination Declination Form, to be filled out by provider. Patient requested to send it back via Call Patient to pick up within ASAP. Document is located in providers tray at front office.Please advise at [facility] [phone] .  Patient states this form is for school and she would need it back this week.

## 2022-07-02 NOTE — Telephone Encounter (Signed)
Forms signed, called pt and notified it is up front for pick up

## 2022-07-02 NOTE — Telephone Encounter (Signed)
Form received, given to provider for signature

## 2022-08-02 ENCOUNTER — Other Ambulatory Visit (HOSPITAL_COMMUNITY): Payer: Self-pay

## 2022-08-02 ENCOUNTER — Telehealth: Payer: Self-pay | Admitting: Pharmacy Technician

## 2022-08-02 NOTE — Telephone Encounter (Signed)
[facility] Patient Product/process development scientist completed.    The patient is insured through [employer]. Wynonia Sours will require a PA. Viread (generic) is $0 and Baraclude (generic) $4  We will continue to follow to see if copay assistance is needed.

## 2022-08-06 ENCOUNTER — Ambulatory Visit: Payer: Medicaid Other | Admitting: Infectious Disease

## 2022-08-06 ENCOUNTER — Encounter: Payer: Self-pay | Admitting: Infectious Disease

## 2022-08-06 ENCOUNTER — Other Ambulatory Visit: Payer: Self-pay

## 2022-08-06 VITALS — BP 100/72 | HR 75 | Temp 99.1°F | Resp 16 | Wt 174.0 lb

## 2022-08-06 DIAGNOSIS — B181 Chronic viral hepatitis B without delta-agent: Secondary | ICD-10-CM | POA: Diagnosis not present

## 2022-08-06 NOTE — Progress Notes (Signed)
Subjective:   Chief complaint: f follow-up for chronic hepatitis B without hepatic coma  Patient ID: Michelle Logan, female    DOB: [birth year], 42 y.o.   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    Not seen her since 2021 when she just delivered her baby.  She does not appear to have much in the way of symptoms of hepatitis B.  She has been asked to be vaccine against hepatitis B which I do understand since she already has antibodies to hepatitis B.  He antibody positive.  Her hep B DNA levels have been relatively low.     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    Social History   Socioeconomic History   Marital status: Married    Spouse name: Not on file   Number of children: Not on file   Years of education: Not on file   Highest education level: Not on file  Occupational History   Not on file  Tobacco Use   Smoking status: Never   Smokeless tobacco: Never  Vaping Use   Vaping status: Never Used  Substance and Sexual Activity   Alcohol use: No    Alcohol/week: 0.0 standard drinks of alcohol   Drug use: No   Sexual activity: Yes    Partners: Male    Birth control/protection: None  Other Topics Concern   Not on file  Social History Narrative   Not on file   Social Determinants of Health   Financial Resource Strain: Not on file  Food Insecurity: No Food Insecurity (06/09/2019)   Hunger Vital Sign    Worried About Running Out of Food in the Last Year: Never true    Ran Out of Food in the Last  Year: Never true  Transportation Needs: No Transportation Needs (06/09/2019)   PRAPARE - Administrator, Civil Service (Medical): No    Lack of Transportation (Non-Medical): No  Physical Activity: Not on file  Stress: Not on file  Social Connections: Not on file    No Known Allergies   Current Outpatient Medications:    Prenatal Vit-Fe Fumarate-FA (PRENATAL VITAMIN PO), Take by mouth., Disp: , Rfl:    Review of Systems  Constitutional:  Negative for activity change, appetite change, chills, diaphoresis, fatigue, fever and unexpected weight change.  HENT:  Negative for congestion, rhinorrhea, sinus pressure, sneezing, sore throat and trouble swallowing.   Eyes:  Negative for photophobia and visual disturbance.  Respiratory:  Negative for cough, chest tightness, shortness of breath, wheezing and stridor.   Cardiovascular:  Negative for chest pain, palpitations and leg swelling.  Gastrointestinal:  Negative for abdominal distention, abdominal pain, anal bleeding, blood in stool, constipation, diarrhea, nausea and vomiting.  Genitourinary:  Negative for difficulty urinating, dysuria, flank pain and hematuria.  Musculoskeletal:  Negative for arthralgias, back pain, gait problem, joint swelling and myalgias.  Skin:  Negative for color change, pallor, rash and wound.  Neurological:  Negative for dizziness, tremors, weakness and light-headedness.  Hematological:  Negative for adenopathy. Does not bruise/bleed easily.  Psychiatric/Behavioral:  Negative for agitation, behavioral problems, confusion, decreased concentration, dysphoric mood and sleep disturbance.        Objective:   Physical Exam Constitutional:      General: She is not in acute distress.    Appearance: Normal appearance. She is well-developed. She is not ill-appearing or diaphoretic.  HENT:     Head: Normocephalic and atraumatic.     Right Ear: Hearing and external ear normal.     Left Ear: Hearing and external  ear normal.     Nose: No nasal deformity or rhinorrhea.  Eyes:     General: No scleral icterus.    Conjunctiva/sclera: Conjunctivae normal.     Right eye: Right conjunctiva is not injected.     Left eye: Left conjunctiva is not injected.     Pupils: Pupils are equal, round, and reactive to light.  Neck:     Vascular: No JVD.  Cardiovascular:     Rate and Rhythm: Normal rate and regular rhythm.     Heart sounds: Normal heart sounds, S1 normal and S2 normal. No murmur heard.    No friction rub.  Abdominal:     General: Bowel sounds are normal. There is no distension.     Palpations: Abdomen is soft.     Tenderness: There is no abdominal tenderness.  Musculoskeletal:        General: Normal range of motion.     Right shoulder: Normal.     Left shoulder: Normal.     Cervical back: Normal range of motion and neck supple.     Right hip: Normal.     Left hip: Normal.     Right knee: Normal.     Left knee: Normal.  Lymphadenopathy:     Head:     Right side of head: No submandibular, preauricular or posterior auricular adenopathy.     Left side of head: No submandibular, preauricular or posterior auricular adenopathy.     Cervical: No cervical adenopathy.     Right cervical: No superficial or deep cervical adenopathy.    Left cervical: No superficial or deep cervical adenopathy.  Skin:    General: Skin is warm and dry.     Coloration: Skin is not pale.     Findings: No abrasion, bruising, ecchymosis, erythema, lesion or rash.     Nails: There is no clubbing.  Neurological:     General: No focal deficit present.     Mental Status: She is alert and oriented to person, place, and time.     Sensory: No sensory deficit.     Coordination: Coordination normal.     Gait: Gait normal.  Psychiatric:        Attention and Perception: She is attentive.        Mood and Affect: Mood normal.        Speech: Speech normal.        Behavior: Behavior normal. Behavior is cooperative.         Thought Content: Thought content normal.        Judgment: Judgment normal.           Assessment & Plan:  Hepatitis B without delta agent without hepatic coma:  Recheck CMP CBC with differential hep B DNA, hep B surface antigen also check hepatitis B surface antibody quantitative to document her immunity against this virus.  We will order right upper set quadrant ultrasound of the liver to screen  for hepatocellular carcinoma

## 2022-08-07 ENCOUNTER — Telehealth: Payer: Self-pay

## 2022-08-07 NOTE — Telephone Encounter (Signed)
-----   Message from [place] sent at 08/07/2022  3:20 PM EDT ----- Surprisingly she does not have very good antibodies to hepatitis B.  We checked these because someone was telling her she needed to be vaccinated against hepatitis B for work.  She already has established infection with hepatitis B and I do not know of any data supporting vaccination with hepatitis B vaccine to people with chronic active hepatitis B to achieve any kind of desirable outcome. ----- Message ----- From: Janace Hoard Lab Results In Sent: 08/07/2022  12:06 AM EDT To: Randall Hiss, MD

## 2022-08-09 ENCOUNTER — Ambulatory Visit (HOSPITAL_COMMUNITY)
Admission: RE | Admit: 2022-08-09 | Discharge: 2022-08-09 | Disposition: A | Discharge status: Home / Self Care | Payer: Medicaid Other | Source: Ambulatory Visit | Attending: Infectious Disease | Admitting: Infectious Disease

## 2022-08-09 ENCOUNTER — Ambulatory Visit (INDEPENDENT_AMBULATORY_CARE_PROVIDER_SITE_OTHER): Payer: Medicaid Other

## 2022-08-09 ENCOUNTER — Other Ambulatory Visit: Payer: Self-pay

## 2022-08-09 DIAGNOSIS — B181 Chronic viral hepatitis B without delta-agent: Secondary | ICD-10-CM | POA: Diagnosis not present

## 2022-08-09 DIAGNOSIS — Z23 Encounter for immunization: Secondary | ICD-10-CM | POA: Diagnosis present

## 2022-09-06 ENCOUNTER — Other Ambulatory Visit: Payer: Self-pay

## 2022-09-06 ENCOUNTER — Ambulatory Visit (INDEPENDENT_AMBULATORY_CARE_PROVIDER_SITE_OTHER): Payer: Medicaid Other

## 2022-09-06 DIAGNOSIS — Z23 Encounter for immunization: Secondary | ICD-10-CM | POA: Diagnosis present

## 2022-10-03 ENCOUNTER — Other Ambulatory Visit: Payer: Self-pay

## 2022-10-03 ENCOUNTER — Ambulatory Visit: Payer: Medicaid Other | Admitting: Obstetrics and Gynecology

## 2022-10-03 ENCOUNTER — Encounter: Payer: Self-pay | Admitting: Obstetrics and Gynecology

## 2022-10-03 VITALS — BP 90/61 | HR 76 | Ht 64.0 in | Wt 175.8 lb

## 2022-10-03 DIAGNOSIS — Z01419 Encounter for gynecological examination (general) (routine) without abnormal findings: Secondary | ICD-10-CM | POA: Diagnosis not present

## 2022-10-03 NOTE — Progress Notes (Signed)
ANNUAL EXAM Patient name: Michelle Logan MRN [id number]  Date of birth: 11/09/1980 Chief Complaint:   well woman  History of Present Illness:   Michelle Logan is a 42 y.o. [phone] being seen today for a routine annual exam.  Current complaints: annual  Menstrual concerns? No  normal monthly cycle Breast or nipple changes? No  Contraception use? Yes condoms Sexually active? Yes female partner  Patient's last menstrual period was 09/25/2022.   Upstream - 10/03/22 1556       Pregnancy Intention Screening   Does the patient want to become pregnant in the next year? No    Does the patient's partner want to become pregnant in the next year? No    Would the patient like to discuss contraceptive options today? No      Contraception Wrap Up   Current Method No Method - Other Reason    End Method No Method - Other Reason    Contraception Counseling Provided No             Last pap     Component Value Date/Time   DIAGPAP  08/31/2018 0000    NEGATIVE FOR INTRAEPITHELIAL LESIONS OR MALIGNANCY.   ADEQPAP  08/31/2018 0000    Satisfactory for evaluation  endocervical/transformation zone component ABSENT.   Last mammogram: not previously indicated - ordered.  Last colonoscopy: n/a.      08/06/2022   10:04 AM 06/20/2022    2:54 PM 08/31/2020    8:46 AM 09/21/2019   10:12 AM 06/09/2019    4:00 PM  Depression screen PHQ 2/9  Decreased Interest 0 0 0 0 0  Down, Depressed, Hopeless 0 0 0 0 0  PHQ - 2 Score 0 0 0 0 0  Altered sleeping   0  0  Tired, decreased energy   0  0  Change in appetite   0  0  Feeling bad or failure about yourself    0  0  Trouble concentrating   0  0  Moving slowly or fidgety/restless   0  0  Suicidal thoughts   0  0  PHQ-9 Score   0  0        08/31/2020    8:46 AM 06/09/2019    4:00 PM 05/13/2019    5:34 PM 02/19/2019   10:12 AM  GAD 7 : Generalized Anxiety Score  Nervous, Anxious, on Edge 0 0 0 0  Control/stop worrying 0 0 0 0  Worry too much -  different things 0 0 0 0  Trouble relaxing 0 0 0 0  Restless 0 0 0 0  Easily annoyed or irritable 0 0 0 0  Afraid - awful might happen 0 0 0 0  Total GAD 7 Score 0 0 0 0     Review of Systems:   Pertinent items are noted in HPI Denies any headaches, blurred vision, fatigue, shortness of breath, chest pain, abdominal pain, abnormal vaginal discharge/itching/odor/irritation, problems with periods, bowel movements, urination, or intercourse unless otherwise stated above. Pertinent History Reviewed:  Reviewed past medical,surgical, social and family history.  Reviewed problem list, medications and allergies. Physical Assessment:   Vitals:   10/03/22 1554  BP: 90/61  Pulse: 76  Weight: 175 lb 12.8 oz (79.7 kg)  Height: 5\' 4"  (1.626 m)  Body mass index is 30.18 kg/m.        Physical Examination:   General appearance - well appearing, and in no distress  Mental status - alert,  oriented to person, place, and time  Psych:  She has a normal mood and affect  Skin - warm and dry, normal color, no suspicious lesions noted  Chest - effort normal, all lung fields clear to auscultation bilaterally  Heart - normal rate and regular rhythm  Breasts - breasts appear normal, no suspicious masses, no skin or nipple changes or  axillary nodes  Abdomen - soft, nontender, nondistended, no masses or organomegaly  Pelvic - deferred  Extremities:  No swelling or varicosities noted  Chaperone present for exam  No results found for this or any previous visit (from the past 24 hour(s)).    Assessment & Plan:  1. Well woman exam with routine gynecological exam - Cervical cancer screening: Discussed guidelines. Pap with HPV due 2025 - STD Testing: not indicated - Birth Control: Discussed options and their risks, benefits and common side effects; discussed VTE with estrogen containing options. Desires: Condoms - Breast Health: Encouraged self breast awareness/SBE. Teaching provided. Discussed limits of  clinical breast exam for detecting breast cancer. Rx given for MXR - F/U 12 months and prn - MM Digital Screening; Future   Orders Placed This Encounter  Procedures   MM Digital Screening    Meds: No orders of the defined types were placed in this encounter.   Follow-up: Return if symptoms worsen or fail to improve, for Annual GYN.  Lorriane Shire, MD 10/03/2022 3:59 PM

## 2022-11-18 ENCOUNTER — Ambulatory Visit
Admission: RE | Admit: 2022-11-18 | Discharge: 2022-11-18 | Disposition: A | Discharge status: Home / Self Care | Payer: Medicaid Other | Source: Ambulatory Visit | Attending: Obstetrics and Gynecology

## 2022-11-18 DIAGNOSIS — Z01419 Encounter for gynecological examination (general) (routine) without abnormal findings: Secondary | ICD-10-CM

## 2022-12-17 ENCOUNTER — Ambulatory Visit (INDEPENDENT_AMBULATORY_CARE_PROVIDER_SITE_OTHER): Payer: Medicaid Other | Admitting: Infectious Disease

## 2022-12-17 ENCOUNTER — Encounter: Payer: Self-pay | Admitting: Infectious Disease

## 2022-12-17 ENCOUNTER — Other Ambulatory Visit: Payer: Self-pay

## 2022-12-17 VITALS — BP 103/70 | HR 71 | Temp 97.9°F | Wt 172.6 lb

## 2022-12-17 DIAGNOSIS — Z2981 Encounter for HIV pre-exposure prophylaxis: Secondary | ICD-10-CM | POA: Diagnosis not present

## 2022-12-17 DIAGNOSIS — Z7185 Encounter for immunization safety counseling: Secondary | ICD-10-CM

## 2022-12-17 DIAGNOSIS — B181 Chronic viral hepatitis B without delta-agent: Secondary | ICD-10-CM | POA: Diagnosis present

## 2022-12-17 NOTE — Progress Notes (Signed)
Subjective:  Chief complaint follow-up for chronic hepatitis B without delta agent without hepatic coma   Patient ID: Michelle Logan, female    DOB: Oct 01, 1980, 42 y.o.   MRN: [id number]  HPI  Discussed the use of AI scribe software for clinical note transcription with the patient, who gave verbal consent to proceed.  History of Present Illness   The patient, with a history of chronic hepatitis B, presents for a follow-up visit. She was last seen in July, following a period of absence due to childbirth. The patient's liver function tests and CBC were normal at the last visit, indicating no liver inflammation. However, the amount of hepatitis B DNA has increased from 250 in 2016 to 7,590 currently. She has no signs of liver cancer as per a recent ultrasound. The patient has received the COVID vaccine but declined the flu shot.       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    Social History   Socioeconomic History   Marital status: Married    Spouse name: Not on file   Number of children: Not on file   Years of education: Not on file   Highest education level: Not on file  Occupational History   Not on file  Tobacco Use   Smoking status: Never   Smokeless tobacco: Never  Vaping Use   Vaping status: Never Used  Substance and Sexual Activity   Alcohol use: No    Alcohol/week: 0.0 standard drinks of alcohol   Drug use: No   Sexual activity: Yes    Partners: Male    Birth control/protection: None  Other Topics Concern   Not on file  Social History Narrative   Not on file   Social Determinants of Health   Financial Resource Strain: Not on file   Food Insecurity: No Food Insecurity (06/09/2019)   Hunger Vital Sign    Worried About Running Out of Food in the Last Year: Never true    Ran Out of Food in the Last Year: Never true  Transportation Needs: No Transportation Needs (06/09/2019)   PRAPARE - Administrator, Civil Service (Medical): No    Lack of Transportation (Non-Medical): No  Physical Activity: Not on file  Stress: Not on file  Social Connections: Not on file    No Known Allergies   Current Outpatient Medications:    Prenatal Vit-Fe Fumarate-FA (PRENATAL VITAMIN PO), Take by mouth., Disp: , Rfl:    Review of Systems  Constitutional:  Negative for activity change, appetite change, chills, diaphoresis, fatigue, fever and unexpected weight change.  HENT:  Negative for congestion, rhinorrhea, sinus pressure, sneezing, sore throat and trouble swallowing.   Eyes:  Negative for photophobia and visual disturbance.  Respiratory:  Negative for cough, chest tightness, shortness of breath, wheezing and stridor.   Cardiovascular:  Negative for chest pain, palpitations and leg swelling.  Gastrointestinal:  Negative for abdominal distention, abdominal pain, anal bleeding, blood in stool, constipation, diarrhea, nausea and vomiting.  Genitourinary:  Negative for difficulty urinating, dysuria, flank pain and hematuria.  Musculoskeletal:  Negative for arthralgias, back pain, gait problem, joint swelling and myalgias.  Skin:  Negative for color change, pallor, rash and wound.  Neurological:  Negative for dizziness, tremors, weakness and light-headedness.  Hematological:  Negative for adenopathy. Does not bruise/bleed easily.  Psychiatric/Behavioral:  Negative for agitation, behavioral problems, confusion, decreased concentration, dysphoric mood and sleep disturbance.        Objective:   Physical Exam Constitutional:      General: She is not in acute distress.    Appearance: Normal appearance. She is well-developed. She  is not ill-appearing or diaphoretic.  HENT:     Head: Normocephalic and atraumatic.     Right Ear: Hearing and external ear normal.     Left Ear: Hearing and external ear normal.     Nose: No nasal deformity or rhinorrhea.  Eyes:     General: No scleral icterus.    Conjunctiva/sclera: Conjunctivae normal.     Right eye: Right conjunctiva is not injected.     Left eye: Left conjunctiva is not injected.     Pupils: Pupils are equal, round, and reactive to light.  Neck:     Vascular: No JVD.  Cardiovascular:     Rate and Rhythm: Normal rate and regular rhythm.     Heart sounds: S1 normal and S2 normal.  Pulmonary:     Effort: Pulmonary effort is normal. No respiratory distress.     Breath sounds: No wheezing.  Abdominal:     General: Bowel sounds are normal. There is no distension.     Palpations: Abdomen is soft.     Tenderness: There is no abdominal tenderness.  Musculoskeletal:        General: Normal range of motion.     Right shoulder: Normal.     Left shoulder: Normal.     Cervical back: Normal range of motion and neck supple.     Right hip: Normal.     Left hip: Normal.     Right knee: Normal.     Left knee: Normal.  Lymphadenopathy:     Head:     Right side of head: No submandibular, preauricular or posterior auricular adenopathy.     Left side of head: No submandibular, preauricular or posterior auricular adenopathy.     Cervical: No cervical adenopathy.     Right cervical: No superficial or deep cervical adenopathy.    Left cervical: No superficial or deep cervical adenopathy.  Skin:    General: Skin is warm and dry.     Coloration: Skin is not pale.     Findings: No abrasion, bruising, ecchymosis, erythema, lesion or rash.     Nails: There is no clubbing.  Neurological:     Mental Status: She is alert and oriented to person, place, and time.     Sensory: No sensory deficit.     Coordination: Coordination normal.     Gait: Gait normal.  Psychiatric:         Attention and Perception: She is attentive.        Mood and Affect: Mood normal.        Speech: Speech normal.        Behavior: Behavior normal. Behavior is cooperative.        Thought Content: Thought content normal.        Judgment: Judgment normal.           Assessment & Plan:   Assessment and Plan    Chronic Hepatitis B Stable liver function tests, but noted increase in Hepatitis B DNA from 250 in 2016  to 7,590 currently. Discussed the limitations of current treatment options and the potential for using Truvada or Descovy for dual HIV prevention and Hepatitis B treatment, although patient does not currently meet criteria for Hepatitis B treatment just by itself. -Repeat Hepatitis B DNA and liver function tests today. -Consider HIV prevention medication in the future if patient's circumstances change.  General Health Maintenance Patient has received COVID vaccine but declined flu shot. -No further action required at this time.  Follow-up in 3 months to review blood work and reassess condition.      HV pre-exposure prophylaxis: she is married in what she believes is a monogamous relationship but I still think considering PrEP is something she should think about and would have benefit of targeting hepatitis B

## 2022-12-18 ENCOUNTER — Ambulatory Visit: Payer: Medicaid Other | Admitting: Infectious Disease

## 2022-12-20 LAB — COMPLETE METABOLIC PANEL WITH GFR
AG Ratio: 1.7 (calc) (ref 1.0–2.5)
ALT: 15 U/L (ref 6–29)
AST: 18 U/L (ref 10–30)
Albumin: 4.5 g/dL (ref 3.6–5.1)
Alkaline phosphatase (APISO): 66 U/L (ref 31–125)
BUN: 8 mg/dL (ref 7–25)
CO2: 28 mmol/L (ref 20–32)
Calcium: 9.8 mg/dL (ref 8.6–10.2)
Chloride: 103 mmol/L (ref 98–110)
Creat: 0.71 mg/dL (ref 0.50–0.99)
Globulin: 2.7 g/dL (ref 1.9–3.7)
Glucose, Bld: 81 mg/dL (ref 65–99)
Potassium: 4.2 mmol/L (ref 3.5–5.3)
Sodium: 139 mmol/L (ref 135–146)
Total Bilirubin: 0.3 mg/dL (ref 0.2–1.2)
Total Protein: 7.2 g/dL (ref 6.1–8.1)
eGFR: 110 mL/min/{1.73_m2} (ref >=60)

## 2022-12-20 LAB — HEPATITIS B DNA, ULTRAQUANTITATIVE, PCR
Hepatitis B DNA: 18600 [IU]/mL — ABNORMAL HIGH
Hepatitis B virus DNA: 4.27 {Log} — ABNORMAL HIGH

## 2022-12-20 LAB — AFP TUMOR MARKER: AFP-Tumor Marker: 4.6 ng/mL

## 2023-03-24 ENCOUNTER — Ambulatory Visit: Payer: Self-pay | Admitting: Infectious Disease

## 2023-03-28 ENCOUNTER — Ambulatory Visit: Admitting: Infectious Disease

## 2023-04-28 ENCOUNTER — Encounter: Payer: Self-pay | Admitting: Infectious Disease

## 2023-04-28 ENCOUNTER — Ambulatory Visit (INDEPENDENT_AMBULATORY_CARE_PROVIDER_SITE_OTHER): Admitting: Infectious Disease

## 2023-04-28 ENCOUNTER — Other Ambulatory Visit: Payer: Self-pay

## 2023-04-28 VITALS — BP 116/75 | HR 86 | Resp 16 | Ht 64.0 in | Wt 173.2 lb

## 2023-04-28 DIAGNOSIS — B181 Chronic viral hepatitis B without delta-agent: Secondary | ICD-10-CM

## 2023-04-28 NOTE — Progress Notes (Signed)
 Subjective:   Chief Complaint  Patient presents with   Follow-up   For hepatitis B   Patient ID: Michelle Logan, female    DOB: [birth year], 43 y.o.   MRN: [id number]  HPI  Discussed the use of AI scribe software for clinical note transcription with the patient, who gave verbal consent to proceed.  History of Present Illness   Michelle Logan is a patient  with a history of Hepatitis B, presents for a routine follow-up. She reports no symptoms and feels generally well. She has been living with Hepatitis B for at least 8 years, possibly contracted at birth in [place]. She has had three pregnancies during this time, with no known transmission of the virus to her children. The patient's viral load has been slowly increasing over time, from an initial level of 250 to a recent level of 18,000. She expresses some concern about this increase, but overall, she feels well and has no symptoms. She has not been on any medication for Hepatitis B.       Past Medical History:  Diagnosis Date   Chronic hepatitis B without delta agent without hepatic coma (HCC) 12/21/2014   Pregnancy and infectious disease 12/21/2014   Thyroid  disease during pregnancy 12/21/2014   UTI (urinary tract infection) 12/21/2014    Past Surgical History:  Procedure Laterality Date   NO PAST SURGERIES      History reviewed. No pertinent family history.    Social History   Socioeconomic History   Marital status: Married    Spouse name: Not on file   Number of children: Not on file   Years of education: Not on file   Highest education level: Not on file  Occupational History   Not on file  Tobacco Use   Smoking status: Never   Smokeless tobacco: Never  Vaping Use   Vaping status: Never Used  Substance and Sexual Activity   Alcohol use: No    Alcohol/week: 0.0 standard drinks of alcohol   Drug use: No   Sexual activity: Yes    Partners: Male    Birth control/protection: None  Other Topics Concern   Not on  file  Social History Narrative   Not on file   Social Drivers of Health   Financial Resource Strain: Not on file  Food Insecurity: No Food Insecurity (06/09/2019)   Hunger Vital Sign    Worried About Running Out of Food in the Last Year: Never true    Ran Out of Food in the Last Year: Never true  Transportation Needs: No Transportation Needs (06/09/2019)   PRAPARE - Administrator, Civil Service (Medical): No    Lack of Transportation (Non-Medical): No  Physical Activity: Not on file  Stress: Not on file  Social Connections: Not on file    No Known Allergies   Current Outpatient Medications:    Prenatal Vit-Fe Fumarate-FA (PRENATAL VITAMIN PO), Take by mouth., Disp: , Rfl:    Review of Systems  Constitutional:  Negative for activity change, appetite change, chills, diaphoresis, fatigue, fever and unexpected weight change.  HENT:  Negative for congestion, rhinorrhea, sinus pressure, sneezing, sore throat and trouble swallowing.   Eyes:  Negative for photophobia and visual disturbance.  Respiratory:  Negative for cough, chest tightness, shortness of breath, wheezing and stridor.   Cardiovascular:  Negative for chest pain, palpitations and leg swelling.  Gastrointestinal:  Negative for abdominal distention, abdominal pain, anal bleeding, blood in stool, constipation, diarrhea, nausea and vomiting.  Genitourinary:  Negative for difficulty urinating, dysuria, flank pain and hematuria.  Musculoskeletal:  Negative for arthralgias, back pain, gait problem, joint swelling and myalgias.  Skin:  Negative for color change, pallor, rash and wound.  Neurological:  Negative for dizziness, tremors, weakness and light-headedness.  Hematological:  Negative for adenopathy. Does not bruise/bleed easily.  Psychiatric/Behavioral:  Negative for agitation, behavioral problems, confusion, decreased concentration, dysphoric mood and sleep disturbance.        Objective:   Physical  Exam Constitutional:      General: She is not in acute distress.    Appearance: Normal appearance. She is well-developed. She is not ill-appearing or diaphoretic.  HENT:     Head: Normocephalic and atraumatic.     Right Ear: Hearing and external ear normal.     Left Ear: Hearing and external ear normal.     Nose: No nasal deformity or rhinorrhea.  Eyes:     General: No scleral icterus.    Conjunctiva/sclera: Conjunctivae normal.     Right eye: Right conjunctiva is not injected.     Left eye: Left conjunctiva is not injected.     Pupils: Pupils are equal, round, and reactive to light.  Neck:     Vascular: No JVD.  Cardiovascular:     Rate and Rhythm: Normal rate and regular rhythm.     Heart sounds: S1 normal and S2 normal.  Pulmonary:     Effort: Pulmonary effort is normal. No respiratory distress.     Breath sounds: No wheezing.  Abdominal:     General: Bowel sounds are normal. There is no distension.     Palpations: Abdomen is soft.     Tenderness: There is no abdominal tenderness.  Musculoskeletal:        General: Normal range of motion.     Right shoulder: Normal.     Left shoulder: Normal.     Cervical back: Normal range of motion and neck supple.     Right hip: Normal.     Left hip: Normal.     Right knee: Normal.     Left knee: Normal.  Lymphadenopathy:     Head:     Right side of head: No submandibular, preauricular or posterior auricular adenopathy.     Left side of head: No submandibular, preauricular or posterior auricular adenopathy.     Cervical: No cervical adenopathy.     Right cervical: No superficial or deep cervical adenopathy.    Left cervical: No superficial or deep cervical adenopathy.  Skin:    General: Skin is warm and dry.     Coloration: Skin is not pale.     Findings: No abrasion, bruising, ecchymosis, erythema, lesion or rash.     Nails: There is no clubbing.  Neurological:     Mental Status: She is alert and oriented to person, place, and  time.     Sensory: No sensory deficit.     Coordination: Coordination normal.     Gait: Gait normal.  Psychiatric:        Attention and Perception: She is attentive.        Speech: Speech normal.        Behavior: Behavior normal. Behavior is cooperative.        Thought Content: Thought content normal.        Judgment: Judgment normal.           Assessment & Plan:  Assessment and Plan    Chronic Hepatitis B Chronic Hepatitis  B with increased viral load to 18,000, asymptomatic. Likely perinatal transmission. Regular liver cancer screening necessary due to carcinogenic nature. Current viral load below treatment threshold. Truvada discussed but not indicated due to low HIV risk. - Order Hepatitis B viral load and liver function tests, AFP - Continue regular liver ultrasounds for cancer screening. - Schedule follow-up appointment in six months.

## 2023-05-01 LAB — CBC WITH DIFFERENTIAL/PLATELET
Absolute Lymphocytes: 2710 {cells}/uL (ref 850–3900)
Absolute Monocytes: 644 {cells}/uL (ref 200–950)
Basophils Absolute: 22 {cells}/uL (ref 0–200)
Basophils Relative: 0.4 %
Eosinophils Absolute: 39 {cells}/uL (ref 15–500)
Eosinophils Relative: 0.7 %
HCT: 38.7 % (ref 35.0–45.0)
Hemoglobin: 12.2 g/dL (ref 11.7–15.5)
MCH: 27.1 pg (ref 27.0–33.0)
MCHC: 31.5 g/dL — ABNORMAL LOW (ref 32.0–36.0)
MCV: 85.8 fL (ref 80.0–100.0)
MPV: 10.9 fL (ref 7.5–12.5)
Monocytes Relative: 11.5 %
Neutro Abs: 2184 {cells}/uL (ref 1500–7800)
Neutrophils Relative %: 39 %
Platelets: 206 10*3/uL (ref 140–400)
RBC: 4.51 10*6/uL (ref 3.80–5.10)
RDW: 13.5 % (ref 11.0–15.0)
Total Lymphocyte: 48.4 %
WBC: 5.6 10*3/uL (ref 3.8–10.8)

## 2023-05-01 LAB — COMPLETE METABOLIC PANEL WITHOUT GFR
AG Ratio: 1.6 (calc) (ref 1.0–2.5)
ALT: 8 U/L (ref 6–29)
AST: 15 U/L (ref 10–30)
Albumin: 4.2 g/dL (ref 3.6–5.1)
Alkaline phosphatase (APISO): 57 U/L (ref 31–125)
BUN: 12 mg/dL (ref 7–25)
CO2: 28 mmol/L (ref 20–32)
Calcium: 9.5 mg/dL (ref 8.6–10.2)
Chloride: 103 mmol/L (ref 98–110)
Creat: 0.7 mg/dL (ref 0.50–0.99)
Globulin: 2.6 g/dL (ref 1.9–3.7)
Glucose, Bld: 89 mg/dL (ref 65–99)
Potassium: 3.7 mmol/L (ref 3.5–5.3)
Sodium: 136 mmol/L (ref 135–146)
Total Bilirubin: 0.3 mg/dL (ref 0.2–1.2)
Total Protein: 6.8 g/dL (ref 6.1–8.1)

## 2023-05-01 LAB — HEPATITIS B DNA, ULTRAQUANTITATIVE, PCR
Hepatitis B DNA: 11400 [IU]/mL — ABNORMAL HIGH
Hepatitis B virus DNA: 4.06 {Log_IU}/mL — ABNORMAL HIGH

## 2023-05-01 LAB — AFP TUMOR MARKER: AFP-Tumor Marker: 4.1 ng/mL

## 2023-05-05 ENCOUNTER — Ambulatory Visit (HOSPITAL_COMMUNITY)
Admission: RE | Admit: 2023-05-05 | Discharge: 2023-05-05 | Disposition: A | Discharge status: Home / Self Care | Source: Ambulatory Visit | Attending: Infectious Disease | Admitting: Infectious Disease

## 2023-05-05 DIAGNOSIS — B181 Chronic viral hepatitis B without delta-agent: Secondary | ICD-10-CM | POA: Diagnosis present

## 2023-08-12 ENCOUNTER — Ambulatory Visit: Payer: Medicaid Other | Admitting: Infectious Disease

## 2023-09-07 NOTE — Progress Notes (Unsigned)
  Chief complaint: follow-up for chronic hepatitis B without hepatic coma or delta agent  Subjective:    Patient ID: Michelle Logan, female    DOB: October 11, 1980, 43 y.o.   MRN: [id number]  HPI  Past Medical History:  Diagnosis Date   Chronic hepatitis B without delta agent without hepatic coma (HCC) 12/21/2014   Pregnancy and infectious disease 12/21/2014   Thyroid  disease during pregnancy 12/21/2014   UTI (urinary tract infection) 12/21/2014    Past Surgical History:  Procedure Laterality Date   NO PAST SURGERIES      No family history on file.    Social History   Socioeconomic History   Marital status: Married    Spouse name: Not on file   Number of children: Not on file   Years of education: Not on file   Highest education level: Not on file  Occupational History   Not on file  Tobacco Use   Smoking status: Never   Smokeless tobacco: Never  Vaping Use   Vaping status: Never Used  Substance and Sexual Activity   Alcohol use: No    Alcohol/week: 0.0 standard drinks of alcohol   Drug use: No   Sexual activity: Yes    Partners: Male    Birth control/protection: None  Other Topics Concern   Not on file  Social History Narrative   Not on file   Social Drivers of Health   Financial Resource Strain: Not on file  Food Insecurity: No Food Insecurity (06/09/2019)   Hunger Vital Sign    Worried About Running Out of Food in the Last Year: Never true    Ran Out of Food in the Last Year: Never true  Transportation Needs: No Transportation Needs (06/09/2019)   PRAPARE - Administrator, Civil Service (Medical): No    Lack of Transportation (Non-Medical): No  Physical Activity: Not on file  Stress: Not on file  Social Connections: Not on file    No Known Allergies   Current Outpatient Medications:    Prenatal Vit-Fe Fumarate-FA (PRENATAL VITAMIN PO), Take by mouth., Disp: , Rfl:     Review of Systems     Objective:   Physical Exam         Assessment & Plan:

## 2023-09-09 ENCOUNTER — Other Ambulatory Visit: Payer: Self-pay

## 2023-09-09 ENCOUNTER — Encounter: Payer: Self-pay | Admitting: Infectious Disease

## 2023-09-09 ENCOUNTER — Ambulatory Visit: Payer: Self-pay | Admitting: Infectious Disease

## 2023-09-09 VITALS — BP 111/73 | HR 69 | Temp 97.2°F | Ht 64.0 in | Wt 170.0 lb

## 2023-09-09 DIAGNOSIS — B181 Chronic viral hepatitis B without delta-agent: Secondary | ICD-10-CM

## 2023-09-12 LAB — COMPLETE METABOLIC PANEL WITHOUT GFR
AG Ratio: 1.9 (calc) (ref 1.0–2.5)
ALT: 10 U/L (ref 6–29)
AST: 15 U/L (ref 10–30)
Albumin: 4.3 g/dL (ref 3.6–5.1)
Alkaline phosphatase (APISO): 53 U/L (ref 31–125)
BUN: 11 mg/dL (ref 7–25)
CO2: 27 mmol/L (ref 20–32)
Calcium: 9.4 mg/dL (ref 8.6–10.2)
Chloride: 106 mmol/L (ref 98–110)
Creat: 0.68 mg/dL (ref 0.50–0.99)
Globulin: 2.3 g/dL (ref 1.9–3.7)
Glucose, Bld: 80 mg/dL (ref 65–99)
Potassium: 4.3 mmol/L (ref 3.5–5.3)
Sodium: 140 mmol/L (ref 135–146)
Total Bilirubin: 0.5 mg/dL (ref 0.2–1.2)
Total Protein: 6.6 g/dL (ref 6.1–8.1)

## 2023-09-12 LAB — HEPATITIS B DNA, ULTRAQUANTITATIVE, PCR
Hepatitis B DNA: 3610 [IU]/mL — ABNORMAL HIGH
Hepatitis B virus DNA: 3.56 {Log_IU}/mL — ABNORMAL HIGH

## 2023-09-12 LAB — AFP TUMOR MARKER: AFP-Tumor Marker: 4.5 ng/mL

## 2023-10-14 ENCOUNTER — Other Ambulatory Visit

## 2023-12-25 ENCOUNTER — Telehealth: Payer: Self-pay | Admitting: Family Medicine

## 2023-12-25 ENCOUNTER — Other Ambulatory Visit: Payer: Self-pay

## 2023-12-25 ENCOUNTER — Ambulatory Visit: Payer: Self-pay | Admitting: Obstetrics and Gynecology

## 2023-12-25 NOTE — Telephone Encounter (Signed)
 Michelle Logan! This patient is uninsured and needs a pap. CAN you all please reach out to her please.
